# Patient Record
Sex: Female | Born: 1960 | Race: White | Hispanic: No | Marital: Married | Smoking: Current every day smoker
Health system: Southern US, Community
[De-identification: ages and names within clinical notes are randomized; demographics above are authoritative.]

## PROBLEM LIST (undated history)

## (undated) DIAGNOSIS — I341 Nonrheumatic mitral (valve) prolapse: Secondary | ICD-10-CM

## (undated) DIAGNOSIS — E7800 Pure hypercholesterolemia, unspecified: Secondary | ICD-10-CM

## (undated) DIAGNOSIS — I10 Essential (primary) hypertension: Secondary | ICD-10-CM

## (undated) DIAGNOSIS — N159 Renal tubulo-interstitial disease, unspecified: Secondary | ICD-10-CM

## (undated) HISTORY — PX: TONSILLECTOMY: SUR1361

---

## 2008-01-21 ENCOUNTER — Encounter (HOSPITAL_COMMUNITY): Admission: RE | Admit: 2008-01-21 | Discharge: 2008-02-20 | Payer: Self-pay | Admitting: Sports Medicine

## 2008-03-21 ENCOUNTER — Encounter (HOSPITAL_COMMUNITY): Admission: RE | Admit: 2008-03-21 | Discharge: 2008-04-13 | Payer: Self-pay | Admitting: Orthopedic Surgery

## 2008-04-18 ENCOUNTER — Encounter (HOSPITAL_COMMUNITY): Admission: RE | Admit: 2008-04-18 | Discharge: 2008-05-18 | Payer: Self-pay | Admitting: Orthopedic Surgery

## 2008-05-19 ENCOUNTER — Encounter (HOSPITAL_COMMUNITY): Admission: RE | Admit: 2008-05-19 | Discharge: 2008-06-18 | Payer: Self-pay | Admitting: Orthopedic Surgery

## 2008-06-20 ENCOUNTER — Encounter (HOSPITAL_COMMUNITY): Admission: RE | Admit: 2008-06-20 | Discharge: 2008-07-20 | Payer: Self-pay | Admitting: Orthopedic Surgery

## 2019-04-19 ENCOUNTER — Emergency Department (HOSPITAL_COMMUNITY): Payer: BC Managed Care – PPO | Admitting: Anesthesiology

## 2019-04-19 ENCOUNTER — Encounter (HOSPITAL_COMMUNITY): Payer: Self-pay | Admitting: Emergency Medicine

## 2019-04-19 ENCOUNTER — Observation Stay (HOSPITAL_COMMUNITY)
Admission: EM | Admit: 2019-04-19 | Discharge: 2019-04-20 | Disposition: A | Discharge status: Home / Self Care | Payer: BC Managed Care – PPO | Attending: General Surgery | Admitting: General Surgery

## 2019-04-19 ENCOUNTER — Encounter (HOSPITAL_COMMUNITY)
Admission: EM | Disposition: A | Discharge status: Home / Self Care | Payer: Self-pay | Source: Home / Self Care | Attending: Emergency Medicine

## 2019-04-19 ENCOUNTER — Emergency Department (HOSPITAL_COMMUNITY): Payer: BC Managed Care – PPO

## 2019-04-19 ENCOUNTER — Other Ambulatory Visit: Payer: Self-pay

## 2019-04-19 DIAGNOSIS — K353 Acute appendicitis with localized peritonitis, without perforation or gangrene: Principal | ICD-10-CM

## 2019-04-19 DIAGNOSIS — Z880 Allergy status to penicillin: Secondary | ICD-10-CM | POA: Insufficient documentation

## 2019-04-19 DIAGNOSIS — K3580 Unspecified acute appendicitis: Secondary | ICD-10-CM | POA: Diagnosis present

## 2019-04-19 DIAGNOSIS — Z20822 Contact with and (suspected) exposure to covid-19: Secondary | ICD-10-CM | POA: Diagnosis not present

## 2019-04-19 DIAGNOSIS — E7800 Pure hypercholesterolemia, unspecified: Secondary | ICD-10-CM | POA: Diagnosis not present

## 2019-04-19 DIAGNOSIS — I341 Nonrheumatic mitral (valve) prolapse: Secondary | ICD-10-CM | POA: Insufficient documentation

## 2019-04-19 DIAGNOSIS — N390 Urinary tract infection, site not specified: Secondary | ICD-10-CM | POA: Insufficient documentation

## 2019-04-19 DIAGNOSIS — I10 Essential (primary) hypertension: Secondary | ICD-10-CM | POA: Insufficient documentation

## 2019-04-19 DIAGNOSIS — N12 Tubulo-interstitial nephritis, not specified as acute or chronic: Secondary | ICD-10-CM

## 2019-04-19 DIAGNOSIS — Z885 Allergy status to narcotic agent status: Secondary | ICD-10-CM | POA: Diagnosis not present

## 2019-04-19 DIAGNOSIS — Z882 Allergy status to sulfonamides status: Secondary | ICD-10-CM | POA: Diagnosis not present

## 2019-04-19 DIAGNOSIS — F17210 Nicotine dependence, cigarettes, uncomplicated: Secondary | ICD-10-CM | POA: Insufficient documentation

## 2019-04-19 HISTORY — DX: Renal tubulo-interstitial disease, unspecified: N15.9

## 2019-04-19 HISTORY — PX: LAPAROSCOPIC APPENDECTOMY: SHX408

## 2019-04-19 HISTORY — DX: Pure hypercholesterolemia, unspecified: E78.00

## 2019-04-19 HISTORY — DX: Essential (primary) hypertension: I10

## 2019-04-19 HISTORY — DX: Nonrheumatic mitral (valve) prolapse: I34.1

## 2019-04-19 LAB — CBC WITH DIFFERENTIAL/PLATELET
Abs Immature Granulocytes: 0.06 10*3/uL (ref 0.00–0.07)
Basophils Absolute: 0 10*3/uL (ref 0.0–0.1)
Basophils Relative: 0 %
Eosinophils Absolute: 0 10*3/uL (ref 0.0–0.5)
Eosinophils Relative: 0 %
HCT: 47.8 % — ABNORMAL HIGH (ref 36.0–46.0)
Hemoglobin: 15.5 g/dL — ABNORMAL HIGH (ref 12.0–15.0)
Immature Granulocytes: 0 %
Lymphocytes Relative: 6 %
Lymphs Abs: 0.9 10*3/uL (ref 0.7–4.0)
MCH: 31.6 pg (ref 26.0–34.0)
MCHC: 32.4 g/dL (ref 30.0–36.0)
MCV: 97.4 fL (ref 80.0–100.0)
Monocytes Absolute: 0.6 10*3/uL (ref 0.1–1.0)
Monocytes Relative: 4 %
Neutro Abs: 13.4 10*3/uL — ABNORMAL HIGH (ref 1.7–7.7)
Neutrophils Relative %: 90 %
Platelets: 175 10*3/uL (ref 150–400)
RBC: 4.91 MIL/uL (ref 3.87–5.11)
RDW: 13 % (ref 11.5–15.5)
WBC: 15 10*3/uL — ABNORMAL HIGH (ref 4.0–10.5)
nRBC: 0 % (ref 0.0–0.2)

## 2019-04-19 LAB — LIPASE, BLOOD: Lipase: 19 U/L (ref 11–51)

## 2019-04-19 LAB — TROPONIN I (HIGH SENSITIVITY)
Troponin I (High Sensitivity): 14 ng/L (ref <18)
Troponin I (High Sensitivity): 26 ng/L — ABNORMAL HIGH (ref <18)

## 2019-04-19 LAB — URINALYSIS, ROUTINE W REFLEX MICROSCOPIC
Bilirubin Urine: NEGATIVE
Glucose, UA: NEGATIVE mg/dL
Ketones, ur: 20 mg/dL — AB
Leukocytes,Ua: NEGATIVE
Nitrite: NEGATIVE
Protein, ur: 100 mg/dL — AB
Specific Gravity, Urine: 1.026 (ref 1.005–1.030)
pH: 6 (ref 5.0–8.0)

## 2019-04-19 LAB — POC SARS CORONAVIRUS 2 AG -  ED: SARS Coronavirus 2 Ag: NEGATIVE

## 2019-04-19 LAB — COMPREHENSIVE METABOLIC PANEL WITH GFR
ALT: 28 U/L (ref 0–44)
AST: 23 U/L (ref 15–41)
Albumin: 4.7 g/dL (ref 3.5–5.0)
Alkaline Phosphatase: 107 U/L (ref 38–126)
Anion gap: 13 (ref 5–15)
BUN: 19 mg/dL (ref 6–20)
CO2: 23 mmol/L (ref 22–32)
Calcium: 10.3 mg/dL (ref 8.9–10.3)
Chloride: 100 mmol/L (ref 98–111)
Creatinine, Ser: 0.96 mg/dL (ref 0.44–1.00)
GFR calc Af Amer: >60 mL/min
GFR calc non Af Amer: >60 mL/min
Glucose, Bld: 138 mg/dL — ABNORMAL HIGH (ref 70–99)
Potassium: 3.9 mmol/L (ref 3.5–5.1)
Sodium: 136 mmol/L (ref 135–145)
Total Bilirubin: 0.6 mg/dL (ref 0.3–1.2)
Total Protein: 8.3 g/dL — ABNORMAL HIGH (ref 6.5–8.1)

## 2019-04-19 LAB — PROTIME-INR
INR: 1 (ref 0.8–1.2)
Prothrombin Time: 12.8 seconds (ref 11.4–15.2)

## 2019-04-19 LAB — RESPIRATORY PANEL BY RT PCR (FLU A&B, COVID)
Influenza A by PCR: NEGATIVE
Influenza B by PCR: NEGATIVE
SARS Coronavirus 2 by RT PCR: NEGATIVE

## 2019-04-19 LAB — LACTIC ACID, PLASMA
Lactic Acid, Venous: 2.4 mmol/L (ref 0.5–1.9)
Lactic Acid, Venous: 1.7 mmol/L (ref 0.5–1.9)

## 2019-04-19 LAB — APTT: aPTT: 27 seconds (ref 24–36)

## 2019-04-19 SURGERY — APPENDECTOMY, LAPAROSCOPIC
Anesthesia: General

## 2019-04-19 MED ORDER — CHLORHEXIDINE GLUCONATE CLOTH 2 % EX PADS: 6.0000 | MEDICATED_PAD | Freq: Once | CUTANEOUS | Status: DC

## 2019-04-19 MED ORDER — BUPIVACAINE LIPOSOME 1.3 % IJ SUSP
INTRAMUSCULAR | Status: DC | PRN
  Administered 2019-04-19: 20 mL

## 2019-04-19 MED ORDER — HEMOSTATIC AGENTS (NO CHARGE) OPTIME
TOPICAL | Status: DC | PRN
  Administered 2019-04-19: 1 via TOPICAL

## 2019-04-19 MED ORDER — SUCCINYLCHOLINE CHLORIDE 20 MG/ML IJ SOLN
INTRAMUSCULAR | Status: DC | PRN
  Administered 2019-04-19: 100 mg via INTRAVENOUS

## 2019-04-19 MED ORDER — LEVOFLOXACIN IN D5W 750 MG/150ML IV SOLN
750.0000 mg | Freq: Once | INTRAVENOUS | Status: AC
  Administered 2019-04-19: 15:00:00 750 mg via INTRAVENOUS
  Filled 2019-04-19: qty 150

## 2019-04-19 MED ORDER — METOPROLOL SUCCINATE ER 25 MG PO TB24
25.0000 mg | ORAL_TABLET | Freq: Every day | ORAL | Status: DC
  Administered 2019-04-20: 25 mg via ORAL
  Filled 2019-04-19: qty 1

## 2019-04-19 MED ORDER — METRONIDAZOLE IN NACL 5-0.79 MG/ML-% IV SOLN
500.0000 mg | Freq: Three times a day (TID) | INTRAVENOUS | Status: DC
  Administered 2019-04-19 – 2019-04-20 (×2): 500 mg via INTRAVENOUS
  Filled 2019-04-19 (×2): qty 100

## 2019-04-19 MED ORDER — DIPHENHYDRAMINE HCL 50 MG/ML IJ SOLN: 25.0000 mg | Freq: Four times a day (QID) | INTRAMUSCULAR | Status: DC | PRN

## 2019-04-19 MED ORDER — SODIUM CHLORIDE 0.9 % IV SOLN
1.0000 g | Freq: Once | INTRAVENOUS | Status: DC
  Filled 2019-04-19: qty 1

## 2019-04-19 MED ORDER — LEVOFLOXACIN IN D5W 750 MG/150ML IV SOLN: 750.0000 mg | INTRAVENOUS | Status: DC

## 2019-04-19 MED ORDER — HYDROMORPHONE HCL 1 MG/ML IJ SOLN
0.2500 mg | INTRAMUSCULAR | Status: DC | PRN
  Administered 2019-04-19 (×3): 0.5 mg via INTRAVENOUS
  Filled 2019-04-19 (×3): qty 0.5

## 2019-04-19 MED ORDER — BUPIVACAINE LIPOSOME 1.3 % IJ SUSP
INTRAMUSCULAR | Status: AC
  Filled 2019-04-19: qty 20

## 2019-04-19 MED ORDER — EPHEDRINE 5 MG/ML INJ
INTRAVENOUS | Status: AC
  Filled 2019-04-19: qty 10

## 2019-04-19 MED ORDER — SODIUM CHLORIDE 0.9 % IV SOLN: INTRAVENOUS | Status: DC

## 2019-04-19 MED ORDER — ONDANSETRON HCL 4 MG/2ML IJ SOLN
4.0000 mg | Freq: Once | INTRAMUSCULAR | Status: AC
  Administered 2019-04-19: 19:00:00 4 mg via INTRAVENOUS
  Filled 2019-04-19: qty 2

## 2019-04-19 MED ORDER — MORPHINE SULFATE (PF) 4 MG/ML IV SOLN
4.0000 mg | Freq: Once | INTRAVENOUS | Status: AC
  Administered 2019-04-19: 13:00:00 4 mg via INTRAVENOUS
  Filled 2019-04-19: qty 1

## 2019-04-19 MED ORDER — SPIRONOLACTONE 25 MG PO TABS
25.0000 mg | ORAL_TABLET | Freq: Every day | ORAL | Status: DC
  Administered 2019-04-20: 25 mg via ORAL
  Filled 2019-04-19: qty 1

## 2019-04-19 MED ORDER — ACETAMINOPHEN 325 MG PO TABS: 650.0000 mg | ORAL_TABLET | Freq: Four times a day (QID) | ORAL | Status: DC | PRN

## 2019-04-19 MED ORDER — PROMETHAZINE HCL 25 MG/ML IJ SOLN
12.5000 mg | Freq: Once | INTRAMUSCULAR | Status: AC
  Administered 2019-04-19: 13:00:00 12.5 mg via INTRAVENOUS
  Filled 2019-04-19: qty 1

## 2019-04-19 MED ORDER — ENOXAPARIN SODIUM 40 MG/0.4ML ~~LOC~~ SOLN
40.0000 mg | SUBCUTANEOUS | Status: DC
  Administered 2019-04-20: 09:00:00 40 mg via SUBCUTANEOUS
  Filled 2019-04-19: qty 0.4

## 2019-04-19 MED ORDER — AMLODIPINE BESYLATE 5 MG PO TABS
10.0000 mg | ORAL_TABLET | Freq: Every day | ORAL | Status: DC
  Administered 2019-04-20: 11:00:00 10 mg via ORAL
  Filled 2019-04-19: qty 2

## 2019-04-19 MED ORDER — KETOROLAC TROMETHAMINE 30 MG/ML IJ SOLN: 30.0000 mg | Freq: Four times a day (QID) | INTRAMUSCULAR | Status: DC | PRN

## 2019-04-19 MED ORDER — ONDANSETRON HCL 4 MG/2ML IJ SOLN
INTRAMUSCULAR | Status: AC
  Filled 2019-04-19: qty 2

## 2019-04-19 MED ORDER — DEXAMETHASONE SODIUM PHOSPHATE 10 MG/ML IJ SOLN
INTRAMUSCULAR | Status: AC
  Filled 2019-04-19: qty 1

## 2019-04-19 MED ORDER — LACTATED RINGERS IV SOLN: INTRAVENOUS | Status: DC | PRN

## 2019-04-19 MED ORDER — ONDANSETRON 4 MG PO TBDP: 4.0000 mg | ORAL_TABLET | Freq: Four times a day (QID) | ORAL | Status: DC | PRN

## 2019-04-19 MED ORDER — DIPHENHYDRAMINE HCL 25 MG PO CAPS: 25.0000 mg | ORAL_CAPSULE | Freq: Four times a day (QID) | ORAL | Status: DC | PRN

## 2019-04-19 MED ORDER — FENTANYL CITRATE (PF) 250 MCG/5ML IJ SOLN
INTRAMUSCULAR | Status: AC
  Filled 2019-04-19: qty 5

## 2019-04-19 MED ORDER — PROMETHAZINE HCL 25 MG/ML IJ SOLN
12.5000 mg | Freq: Once | INTRAMUSCULAR | Status: AC
  Administered 2019-04-19: 23:00:00 12.5 mg via INTRAVENOUS

## 2019-04-19 MED ORDER — KETOROLAC TROMETHAMINE 30 MG/ML IJ SOLN
30.0000 mg | Freq: Four times a day (QID) | INTRAMUSCULAR | Status: AC
  Administered 2019-04-19: 30 mg via INTRAVENOUS
  Filled 2019-04-19: qty 1

## 2019-04-19 MED ORDER — LORAZEPAM 2 MG/ML IJ SOLN: 1.0000 mg | INTRAMUSCULAR | Status: DC | PRN

## 2019-04-19 MED ORDER — ACETAMINOPHEN 650 MG RE SUPP: 650.0000 mg | Freq: Four times a day (QID) | RECTAL | Status: DC | PRN

## 2019-04-19 MED ORDER — PROPOFOL 10 MG/ML IV BOLUS
INTRAVENOUS | Status: DC | PRN
  Administered 2019-04-19: 160 mg via INTRAVENOUS

## 2019-04-19 MED ORDER — ROCURONIUM BROMIDE 100 MG/10ML IV SOLN
INTRAVENOUS | Status: DC | PRN
  Administered 2019-04-19: 40 mg via INTRAVENOUS

## 2019-04-19 MED ORDER — PROPOFOL 10 MG/ML IV BOLUS
INTRAVENOUS | Status: AC
  Filled 2019-04-19: qty 20

## 2019-04-19 MED ORDER — FENTANYL CITRATE (PF) 100 MCG/2ML IJ SOLN
INTRAMUSCULAR | Status: DC | PRN
  Administered 2019-04-19: 150 ug via INTRAVENOUS
  Administered 2019-04-19 (×2): 75 ug via INTRAVENOUS
  Administered 2019-04-19: 100 ug via INTRAVENOUS
  Administered 2019-04-19: 50 ug via INTRAVENOUS

## 2019-04-19 MED ORDER — HYDROCODONE-ACETAMINOPHEN 7.5-325 MG PO TABS: 1.0000 | ORAL_TABLET | Freq: Once | ORAL | Status: DC | PRN

## 2019-04-19 MED ORDER — FENTANYL CITRATE (PF) 100 MCG/2ML IJ SOLN: 50.0000 ug | INTRAMUSCULAR | Status: DC | PRN

## 2019-04-19 MED ORDER — DEXAMETHASONE SODIUM PHOSPHATE 10 MG/ML IJ SOLN
INTRAMUSCULAR | Status: DC | PRN
  Administered 2019-04-19: 5 mg via INTRAVENOUS

## 2019-04-19 MED ORDER — LIDOCAINE 2% (20 MG/ML) 5 ML SYRINGE
INTRAMUSCULAR | Status: AC
  Filled 2019-04-19: qty 5

## 2019-04-19 MED ORDER — LACTATED RINGERS IV SOLN: INTRAVENOUS | Status: DC

## 2019-04-19 MED ORDER — MIDAZOLAM HCL 2 MG/2ML IJ SOLN: 0.5000 mg | Freq: Once | INTRAMUSCULAR | Status: DC | PRN

## 2019-04-19 MED ORDER — SODIUM CHLORIDE 0.9 % IR SOLN
Status: DC | PRN
  Administered 2019-04-19: 1000 mL

## 2019-04-19 MED ORDER — KETOROLAC TROMETHAMINE 30 MG/ML IJ SOLN
INTRAMUSCULAR | Status: AC
  Filled 2019-04-19: qty 1

## 2019-04-19 MED ORDER — PROMETHAZINE HCL 25 MG/ML IJ SOLN
6.2500 mg | INTRAMUSCULAR | Status: AC | PRN
  Administered 2019-04-19 (×2): 6.25 mg via INTRAVENOUS
  Filled 2019-04-19: qty 1

## 2019-04-19 MED ORDER — ONDANSETRON HCL 4 MG/2ML IJ SOLN
INTRAMUSCULAR | Status: DC | PRN
  Administered 2019-04-19: 4 mg via INTRAVENOUS

## 2019-04-19 MED ORDER — ONDANSETRON HCL 4 MG/2ML IJ SOLN
4.0000 mg | Freq: Four times a day (QID) | INTRAMUSCULAR | Status: DC | PRN
  Administered 2019-04-19 – 2019-04-20 (×2): 4 mg via INTRAVENOUS
  Filled 2019-04-19: qty 2

## 2019-04-19 MED ORDER — METRONIDAZOLE IN NACL 5-0.79 MG/ML-% IV SOLN
500.0000 mg | Freq: Once | INTRAVENOUS | Status: AC
  Administered 2019-04-19: 15:00:00 500 mg via INTRAVENOUS
  Filled 2019-04-19: qty 100

## 2019-04-19 MED ORDER — SODIUM CHLORIDE 0.9 % IV BOLUS
1000.0000 mL | Freq: Once | INTRAVENOUS | Status: AC
  Administered 2019-04-19: 13:00:00 1000 mL via INTRAVENOUS

## 2019-04-19 MED ORDER — TRAMADOL HCL 50 MG PO TABS
50.0000 mg | ORAL_TABLET | Freq: Four times a day (QID) | ORAL | Status: DC | PRN
  Administered 2019-04-20: 09:00:00 50 mg via ORAL
  Filled 2019-04-19: qty 1

## 2019-04-19 MED ORDER — ROSUVASTATIN CALCIUM 10 MG PO TABS
10.0000 mg | ORAL_TABLET | Freq: Every day | ORAL | Status: DC
  Administered 2019-04-20: 10 mg via ORAL
  Filled 2019-04-19: qty 1

## 2019-04-19 MED ORDER — LEVOFLOXACIN IN D5W 500 MG/100ML IV SOLN: 500.0000 mg | INTRAVENOUS | Status: DC

## 2019-04-19 MED ORDER — EPHEDRINE SULFATE 50 MG/ML IJ SOLN
INTRAMUSCULAR | Status: DC | PRN
  Administered 2019-04-19 (×2): 10 mg via INTRAVENOUS

## 2019-04-19 MED ORDER — SCOPOLAMINE 1 MG/3DAYS TD PT72
MEDICATED_PATCH | TRANSDERMAL | Status: AC
  Filled 2019-04-19: qty 1

## 2019-04-19 MED ORDER — SCOPOLAMINE 1 MG/3DAYS TD PT72
1.0000 | MEDICATED_PATCH | TRANSDERMAL | Status: DC
  Administered 2019-04-19: 22:00:00 1.5 mg via TRANSDERMAL

## 2019-04-19 MED ORDER — SODIUM CHLORIDE 0.9 % IV BOLUS (SEPSIS)
1500.0000 mL | Freq: Once | INTRAVENOUS | Status: AC
  Administered 2019-04-19: 1500 mL via INTRAVENOUS

## 2019-04-19 MED ORDER — SODIUM CHLORIDE 0.9 % IR SOLN
Status: DC | PRN
  Administered 2019-04-19: 3000 mL

## 2019-04-19 MED ORDER — IOHEXOL 300 MG/ML  SOLN
100.0000 mL | Freq: Once | INTRAMUSCULAR | Status: AC | PRN
  Administered 2019-04-19: 16:00:00 100 mL via INTRAVENOUS

## 2019-04-19 MED ORDER — SIMETHICONE 80 MG PO CHEW: 40.0000 mg | CHEWABLE_TABLET | Freq: Four times a day (QID) | ORAL | Status: DC | PRN

## 2019-04-19 MED ORDER — SUGAMMADEX SODIUM 500 MG/5ML IV SOLN
INTRAVENOUS | Status: DC | PRN
  Administered 2019-04-19: 133.4 mg via INTRAVENOUS

## 2019-04-19 SURGICAL SUPPLY — 51 items
BAG RETRIEVAL 10 (BASKET) ×1
BAG RETRIEVAL 10MM (BASKET) ×1
CHLORAPREP W/TINT 26 (MISCELLANEOUS) ×3 IMPLANT
CLOTH BEACON ORANGE TIMEOUT ST (SAFETY) ×3 IMPLANT
COVER LIGHT HANDLE STERIS (MISCELLANEOUS) ×6 IMPLANT
COVER WAND RF STERILE (DRAPES) ×3 IMPLANT
CUTTER FLEX LINEAR 45M (STAPLE) ×3 IMPLANT
DECANTER SPIKE VIAL GLASS SM (MISCELLANEOUS) ×3 IMPLANT
DERMABOND ADVANCED (GAUZE/BANDAGES/DRESSINGS) ×2
DERMABOND ADVANCED .7 DNX12 (GAUZE/BANDAGES/DRESSINGS) ×1 IMPLANT
ELECT REM PT RETURN 9FT ADLT (ELECTROSURGICAL) ×3
ELECTRODE REM PT RTRN 9FT ADLT (ELECTROSURGICAL) ×1 IMPLANT
GLOVE BIO SURGEON STRL SZ7 (GLOVE) ×3 IMPLANT
GLOVE BIOGEL PI IND STRL 7.0 (GLOVE) ×4 IMPLANT
GLOVE BIOGEL PI IND STRL 7.5 (GLOVE) ×1 IMPLANT
GLOVE BIOGEL PI INDICATOR 7.0 (GLOVE) ×8
GLOVE BIOGEL PI INDICATOR 7.5 (GLOVE) ×2
GLOVE SURG SS PI 7.5 STRL IVOR (GLOVE) ×3 IMPLANT
GOWN STRL REUS W/TWL LRG LVL3 (GOWN DISPOSABLE) ×6 IMPLANT
HEMOSTAT SURGICEL 4X8 (HEMOSTASIS) ×3 IMPLANT
INST SET LAPROSCOPIC AP (KITS) ×3 IMPLANT
IV NS IRRIG 3000ML ARTHROMATIC (IV SOLUTION) ×3 IMPLANT
KIT TURNOVER KIT A (KITS) ×3 IMPLANT
MANIFOLD NEPTUNE II (INSTRUMENTS) ×3 IMPLANT
NEEDLE HYPO 18GX1.5 BLUNT FILL (NEEDLE) ×3 IMPLANT
NEEDLE HYPO 22GX1.5 SAFETY (NEEDLE) ×3 IMPLANT
NEEDLE INSUFFLATION 14GA 120MM (NEEDLE) ×3 IMPLANT
NS IRRIG 1000ML POUR BTL (IV SOLUTION) ×3 IMPLANT
PACK LAP CHOLE LZT030E (CUSTOM PROCEDURE TRAY) ×3 IMPLANT
PAD ARMBOARD 7.5X6 YLW CONV (MISCELLANEOUS) ×3 IMPLANT
PENCIL HANDSWITCHING (ELECTRODE) ×3 IMPLANT
RELOAD 45 VASCULAR/THIN (ENDOMECHANICALS) IMPLANT
RELOAD STAPLE TA45 3.5 REG BLU (ENDOMECHANICALS) ×3 IMPLANT
SET BASIN LINEN APH (SET/KITS/TRAYS/PACK) ×3 IMPLANT
SET TUBE IRRIG SUCTION NO TIP (IRRIGATION / IRRIGATOR) ×3 IMPLANT
SET TUBE SMOKE EVAC HIGH FLOW (TUBING) ×3 IMPLANT
SHEARS HARMONIC ACE PLUS 36CM (ENDOMECHANICALS) ×3 IMPLANT
SUT MNCRL AB 4-0 PS2 18 (SUTURE) ×9 IMPLANT
SUT VICRYL 0 UR6 27IN ABS (SUTURE) ×3 IMPLANT
SYR 20ML LL LF (SYRINGE) ×6 IMPLANT
SYS BAG RETRIEVAL 10MM (BASKET) ×1
SYSTEM BAG RETRIEVAL 10MM (BASKET) ×1 IMPLANT
TRAY FOLEY W/BAG SLVR 16FR (SET/KITS/TRAYS/PACK) ×2
TRAY FOLEY W/BAG SLVR 16FR ST (SET/KITS/TRAYS/PACK) ×1 IMPLANT
TROCAR ENDO BLADELESS 11MM (ENDOMECHANICALS) ×3 IMPLANT
TROCAR ENDO BLADELESS 12MM (ENDOMECHANICALS) ×3 IMPLANT
TROCAR XCEL NON-BLD 5MMX100MML (ENDOMECHANICALS) ×3 IMPLANT
TUBE CONNECTING 12'X1/4 (SUCTIONS) ×1
TUBE CONNECTING 12X1/4 (SUCTIONS) ×2 IMPLANT
WARMER LAPAROSCOPE (MISCELLANEOUS) ×3 IMPLANT
YANKAUER SUCT 12FT TUBE ARGYLE (SUCTIONS) ×3 IMPLANT

## 2019-04-19 NOTE — ED Notes (Signed)
Not able to obtain 2nd IV, pt very difficult stick.

## 2019-04-19 NOTE — H&P (Signed)
Chloe Wall is an 59 y.o. female.   Chief Complaint: Right lower quadrant abdominal pain HPI: Patient is a 59 year old white female who presented emergency room with a 24-hour history of worsening right lower quadrant abdominal pain, nausea, and vomiting.  CT scan of the abdomen reveals acute appendicitis with appendicolith present.  She states her pain started on the left side of her abdomen yesterday morning.  Throughout the day, she continued to get worse and started developing nausea and vomiting.  She thought initially was a urinary tract infection, but a CT scan of the abdomen was performed.  She states her pain is 8 out of 10.  Past Medical History:  Diagnosis Date  . High cholesterol   . Hypertension   . Kidney infection   . Mitral valve prolapse     Past Surgical History:  Procedure Laterality Date  . TONSILLECTOMY      History reviewed. No pertinent family history. Social History:  reports that she has been smoking cigarettes. She has never used smokeless tobacco. She reports that she does not drink alcohol or use drugs.  Allergies:  Allergies  Allergen Reactions  . Penicillins Anaphylaxis    Did it involve swelling of the face/tongue/throat, SOB, or low BP? Yes Did it involve sudden or severe rash/hives, skin peeling, or any reaction on the inside of your mouth or nose? Yes Did you need to seek medical attention at a hospital or doctor's office? Yes When did it last happen? If all above answers are "NO", may proceed with cephalosporin use.      . Codeine Other (See Comments)    Abdominal pain, vomiting   . Sulfa Antibiotics Nausea And Vomiting    (Not in a hospital admission)   Results for orders placed or performed during the hospital encounter of 04/19/19 (from the past 48 hour(s))  Urinalysis, Routine w reflex microscopic     Status: Abnormal   Collection Time: 04/19/19 12:14 PM  Result Value Ref Range   Color, Urine YELLOW YELLOW   APPearance  HAZY (A) CLEAR   Specific Gravity, Urine 1.026 1.005 - 1.030   pH 6.0 5.0 - 8.0   Glucose, UA NEGATIVE NEGATIVE mg/dL   Hgb urine dipstick LARGE (A) NEGATIVE   Bilirubin Urine NEGATIVE NEGATIVE   Ketones, ur 20 (A) NEGATIVE mg/dL   Protein, ur 100 (A) NEGATIVE mg/dL   Nitrite NEGATIVE NEGATIVE   Leukocytes,Ua NEGATIVE NEGATIVE   RBC / HPF 11-20 0 - 5 RBC/hpf   WBC, UA 6-10 0 - 5 WBC/hpf   Bacteria, UA MANY (A) NONE SEEN   Squamous Epithelial / LPF 6-10 0 - 5   Mucus PRESENT     Comment: Performed at [facility], [address], [place], [place] [address]  Comprehensive metabolic panel     Status: Abnormal   Collection Time: 04/19/19 12:56 PM  Result Value Ref Range   Sodium 136 135 - 145 mmol/L   Potassium 3.9 3.5 - 5.1 mmol/L   Chloride 100 98 - 111 mmol/L   CO2 23 22 - 32 mmol/L   Glucose, Bld 138 (H) 70 - 99 mg/dL   BUN 19 6 - 20 mg/dL   Creatinine, Ser 0.96 0.44 - 1.00 mg/dL   Calcium 10.3 8.9 - 10.3 mg/dL   Total Protein 8.3 (H) 6.5 - 8.1 g/dL   Albumin 4.7 3.5 - 5.0 g/dL   AST 23 15 - 41 U/L   ALT 28 0 - 44 U/L  Alkaline Phosphatase 107 38 - 126 U/L   Total Bilirubin 0.6 0.3 - 1.2 mg/dL   GFR calc non Af Amer >60 >60 mL/min   GFR calc Af Amer >60 >60 mL/min   Anion gap 13 5 - 15    Comment: Performed at [facility], [address], [place], [place] [address]  Lipase, blood     Status: None   Collection Time: 04/19/19 12:56 PM  Result Value Ref Range   Lipase 19 11 - 51 U/L    Comment: Performed at [facility], [address], [place], [place] [address]  CBC with Differential     Status: Abnormal   Collection Time: 04/19/19 12:56 PM  Result Value Ref Range   WBC 15.0 (H) 4.0 - 10.5 K/uL   RBC 4.91 3.87 - 5.11 MIL/uL   Hemoglobin 15.5 (H) 12.0 - 15.0 g/dL   HCT 47.8 (H) 36.0 - 46.0 %   MCV 97.4 80.0 - 100.0 fL   MCH 31.6 26.0 - 34.0 pg   MCHC 32.4 30.0 - 36.0 g/dL   RDW 13.0 11.5 - 15.5 %   Platelets 175 150 - 400 K/uL   nRBC 0.0 0.0 - 0.2 %   Neutrophils  Relative % 90 %   Neutro Abs 13.4 (H) 1.7 - 7.7 K/uL   Lymphocytes Relative 6 %   Lymphs Abs 0.9 0.7 - 4.0 K/uL   Monocytes Relative 4 %   Monocytes Absolute 0.6 0.1 - 1.0 K/uL   Eosinophils Relative 0 %   Eosinophils Absolute 0.0 0.0 - 0.5 K/uL   Basophils Relative 0 %   Basophils Absolute 0.0 0.0 - 0.1 K/uL   Immature Granulocytes 0 %   Abs Immature Granulocytes 0.06 0.00 - 0.07 K/uL    Comment: Performed at [facility], [address], [place], [place] [address]  Lactic acid, plasma     Status: Abnormal   Collection Time: 04/19/19 12:56 PM  Result Value Ref Range   Lactic Acid, Venous 2.4 (HH) 0.5 - 1.9 mmol/L    Comment: CRITICAL RESULT CALLED TO, READ BACK BY AND VERIFIED WITH: MARTIN,D AT 1340 ON 1.5.21 BY ISLEY,B Performed at [facility], [address], [place], [place] [address]   POC SARS Coronavirus 2 Ag-ED - Nasal Swab (BD Veritor Kit)     Status: None   Collection Time: 04/19/19  2:14 PM  Result Value Ref Range   SARS Coronavirus 2 Ag NEGATIVE NEGATIVE    Comment: (NOTE) SARS-CoV-2 antigen NOT DETECTED.  Negative results are presumptive.  Negative results do not preclude SARS-CoV-2 infection and should not be used as the sole basis for treatment or other patient management decisions, including infection  control decisions, particularly in the presence of clinical signs and  symptoms consistent with COVID-19, or in those who have been in contact with the virus.  Negative results must be combined with clinical observations, patient history, and epidemiological information. The expected result is Negative. Fact Sheet for Patients: PodPark.tn Fact Sheet for Healthcare Providers: GiftContent.is This test is not yet approved or cleared by the [place] FDA and  has been authorized for detection and/or diagnosis of SARS-CoV-2 by FDA under an Emergency Use Authorization (EUA).  This EUA will remain in effect  (meaning this test can be used) for the duration of  the COVID-19 de claration under Section 564(b)(1) of the Act, 21 U.S.C. section 360bbb-3(b)(1), unless the authorization is terminated or revoked sooner.   APTT     Status: None  Collection Time: 04/19/19  3:15 PM  Result Value Ref Range   aPTT 27 24 - 36 seconds    Comment: Performed at [facility], [address], [place], [place] [address]  Protime-INR     Status: None   Collection Time: 04/19/19  3:15 PM  Result Value Ref Range   Prothrombin Time 12.8 11.4 - 15.2 seconds   INR 1.0 0.8 - 1.2    Comment: (NOTE) INR goal varies based on device and disease states. Performed at [facility], [address] [place], [place] [address]   Lactic acid, plasma     Status: None   Collection Time: 04/19/19  3:16 PM  Result Value Ref Range   Lactic Acid, Venous 1.7 0.5 - 1.9 mmol/L    Comment: Performed at [facility], [address] [place], [place] [address]  Blood Culture (routine x 2)     Status: None (Preliminary result)   Collection Time: 04/19/19  3:16 PM   Specimen: BLOOD RIGHT HAND  Result Value Ref Range   Specimen Description BLOOD RIGHT HAND    Special Requests      BOTTLES DRAWN AEROBIC ONLY Blood Culture adequate volume Performed at [facility], [address] [place], [place] [address]    Culture PENDING    Report Status PENDING   Troponin I (High Sensitivity)     Status: None   Collection Time: 04/19/19  3:16 PM  Result Value Ref Range   Troponin I (High Sensitivity) 14 <18 ng/L    Comment: (NOTE) Elevated high sensitivity troponin I (hsTnI) values and significant  changes across serial measurements may suggest ACS but many other  chronic and acute conditions are known to elevate hsTnI results.  Refer to the "Links" section for chest pain algorithms and additional  guidance. Performed at [facility], [address] [place], [place] [address]   Troponin I (High Sensitivity)     Status: Abnormal   Collection  Time: 04/19/19  5:03 PM  Result Value Ref Range   Troponin I (High Sensitivity) 26 (H) <18 ng/L    Comment: (NOTE) Elevated high sensitivity troponin I (hsTnI) values and significant  changes across serial measurements may suggest ACS but many other  chronic and acute conditions are known to elevate hsTnI results.  Refer to the "Links" section for chest pain algorithms and additional  guidance. Performed at [facility], [address], [place], [place] [address]   Respiratory Panel by RT PCR (Flu A&B, Covid) - Nasopharyngeal Swab     Status: None   Collection Time: 04/19/19  5:12 PM   Specimen: Nasopharyngeal Swab  Result Value Ref Range   SARS Coronavirus 2 by RT PCR NEGATIVE NEGATIVE    Comment: (NOTE) SARS-CoV-2 target nucleic acids are NOT DETECTED. The SARS-CoV-2 RNA is generally detectable in upper respiratoy specimens during the acute phase of infection. The lowest concentration of SARS-CoV-2 viral copies this assay can detect is 131 copies/mL. A negative result does not preclude SARS-Cov-2 infection and should not be used as the sole basis for treatment or other patient management decisions. A negative result may occur with  improper specimen collection/handling, submission of specimen other than nasopharyngeal swab, presence of viral mutation(s) within the areas targeted by this assay, and inadequate number of viral copies (<131 copies/mL). A negative result must be combined with clinical observations, patient history, and epidemiological information. The expected result is Negative. Fact Sheet for Patients:  PinkCheek.be Fact Sheet for Healthcare Providers:  GravelBags.it This test  is not yet ap proved or cleared by the [place] and  has been authorized for detection and/or diagnosis of SARS-CoV-2 by FDA under an Emergency Use Authorization (EUA). This EUA will remain  in effect (meaning this test can be  used) for the duration of the COVID-19 declaration under Section 564(b)(1) of the Act, 21 U.S.C. section 360bbb-3(b)(1), unless the authorization is terminated or revoked sooner.    Influenza A by PCR NEGATIVE NEGATIVE   Influenza B by PCR NEGATIVE NEGATIVE    Comment: (NOTE) The Xpert Xpress SARS-CoV-2/FLU/RSV assay is intended as an aid in  the diagnosis of influenza from Nasopharyngeal swab specimens and  should not be used as a sole basis for treatment. Nasal washings and  aspirates are unacceptable for Xpert Xpress SARS-CoV-2/FLU/RSV  testing. Fact Sheet for Patients: PinkCheek.be Fact Sheet for Healthcare Providers: GravelBags.it This test is not yet approved or cleared by the [place] FDA and  has been authorized for detection and/or diagnosis of SARS-CoV-2 by  FDA under an Emergency Use Authorization (EUA). This EUA will remain  in effect (meaning this test can be used) for the duration of the  Covid-19 declaration under Section 564(b)(1) of the Act, 21  U.S.C. section 360bbb-3(b)(1), unless the authorization is  terminated or revoked. Performed at [facility], [address], [place], [place] [address]    [place]  Addendum Date: 04/19/2019   ADDENDUM REPORT: 04/19/2019 16:54 ADDENDUM: Results were discussed with Dr. Milton Ferguson at 4:53 p.m. [place] on April 19, 2019. Electronically Signed   By: Virgina Norfolk M.D.   On: 04/19/2019 16:54   Result Date: 04/19/2019 CLINICAL DATA:  Right lower quadrant pain. EXAM: CT ABDOMEN AND PELVIS WITH CONTRAST TECHNIQUE: Multidetector CT imaging of the abdomen and pelvis was performed using the standard protocol following bolus administration of intravenous contrast. CONTRAST:  128m OMNIPAQUE IOHEXOL 300 MG/ML  SOLN COMPARISON:  None. FINDINGS: Lower chest: No acute abnormality. Hepatobiliary: No focal liver abnormality is seen. No gallstones, gallbladder  wall thickening, or biliary dilatation. Pancreas: Unremarkable. No pancreatic ductal dilatation or surrounding inflammatory changes. Spleen: Normal in size without focal abnormality. Adrenals/Urinary Tract: Adrenal glands are unremarkable. Kidneys are normal, without renal calculi or hydronephrosis. A 9 mm simple cyst is seen within the posterolateral aspect of the mid right kidney. Bladder is unremarkable. Stomach/Bowel: Stomach is within normal limits. A dilated (1.3 cm) fluid-filled appendix is seen. There is a mild amount of periappendiceal inflammatory fat stranding. A 1.0 cm appendicolith is noted within the base of the appendix. Vascular/Lymphatic: There is moderate severity atherosclerosis. No enlarged abdominal or pelvic lymph nodes. Reproductive: Uterus and bilateral adnexa are unremarkable. Other: No abdominal wall hernia or abnormality. No abdominopelvic ascites. Musculoskeletal: No acute or significant osseous findings. IMPRESSION: 1. Appendicitis without evidence of associated perforation or abscess. Electronically Signed: By: TVirgina NorfolkM.D. On: 04/19/2019 16:48   DG Chest Port 1 View  Result Date: 04/19/2019 CLINICAL DATA:  Sepsis EXAM: PORTABLE CHEST 1 VIEW COMPARISON:  None. FINDINGS: The heart size and mediastinal contours are within normal limits. No focal airspace consolidation, pleural effusion, or pneumothorax. Widened appearance of the right acromioclavicular joint which may be postsurgical or posttraumatic. The visualized skeletal structures are otherwise unremarkable. IMPRESSION: No acute cardiopulmonary findings. Electronically Signed   By: NDavina PokeD.O.   On: 04/19/2019 14:43    Review of Systems  Constitutional: Positive for fatigue.  HENT: Negative.   Eyes: Negative.   Respiratory: Negative.  Cardiovascular: Negative.   Gastrointestinal: Positive for abdominal pain, nausea and vomiting.  Endocrine: Negative.   Genitourinary: Positive for dysuria.   Musculoskeletal: Negative.   Allergic/Immunologic: Negative.   Neurological: Negative.   Hematological: Negative.   Psychiatric/Behavioral: Negative.     Blood pressure (!) 148/53, pulse 73, temperature 98.5 F (36.9 C), temperature source Oral, resp. rate 17, height _0  (1.626 m), weight 66.7 kg, SpO2 98 %. Physical Exam  Vitals reviewed. Constitutional: She is oriented to person, place, and time. She appears well-developed and well-nourished. No distress.  HENT:  Head: Atraumatic.  Cardiovascular: Normal rate, regular rhythm and normal heart sounds. Exam reveals no gallop and no friction rub.  No murmur heard. Respiratory: Effort normal and breath sounds normal. No respiratory distress. She has no wheezes. She has no rales.  GI: Soft. Bowel sounds are normal. She exhibits no distension. There is abdominal tenderness. There is guarding. There is no rebound.  Tender in the right lower quadrant just inferior to  McBurney's point.  No rigidity is noted.  Neurological: She is alert and oriented to person, place, and time.  Skin: Skin is warm and dry.  CT scan reviewed  Assessment/Plan Impression: Acute appendicitis Plan: Patient be taken to the operating room for laparoscopic appendectomy.  The risks and benefits of the procedure including bleeding, infection, and the possibility of an open procedure were fully explained to the patient, who gave informed consent.  Aviva Signs, MD 04/19/2019, 6:28 PM

## 2019-04-19 NOTE — Anesthesia Postprocedure Evaluation (Signed)
Anesthesia Post Note  Patient: Chloe Wall  Procedure(s) Performed: APPENDECTOMY LAPAROSCOPIC (N/A )  Patient location during evaluation: PACU Anesthesia Type: General Level of consciousness: awake, awake and alert and oriented Pain management: pain level controlled Vital Signs Assessment: post-procedure vital signs reviewed and stable Respiratory status: spontaneous breathing, respiratory function stable and nonlabored ventilation Cardiovascular status: blood pressure returned to baseline and stable Postop Assessment: no headache Anesthetic complications: no     Last Vitals:  Vitals:   04/19/19 1930 04/19/19 2103  BP: (!) 140/43   Pulse:    Resp:  (P) 15  Temp:  (P) 36.7 C  SpO2:      Last Pain:  Vitals:   04/19/19 1845  TempSrc: Oral  PainSc:                  Dorena Cookey

## 2019-04-19 NOTE — Anesthesia Procedure Notes (Signed)
Procedure Name: Intubation Date/Time: 04/19/2019 7:58 PM Performed by: Lenice Llamas, MD Pre-anesthesia Checklist: Patient identified, Emergency Drugs available, Suction available, Patient being monitored and Timeout performed Patient Re-evaluated:Patient Re-evaluated prior to induction Oxygen Delivery Method: Circle system utilized Preoxygenation: Pre-oxygenation with 100% oxygen Induction Type: IV induction, Cricoid Pressure applied and Rapid sequence Laryngoscope Size: Mac and 4 Grade View: Grade II Tube type: Oral Tube size: 7.0 mm Number of attempts: 1 Airway Equipment and Method: Stylet and Bite block Placement Confirmation: ETT inserted through vocal cords under direct vision,  positive ETCO2 and breath sounds checked- equal and bilateral Secured at: 22 cm Tube secured with: Tape Dental Injury: Teeth and Oropharynx as per pre-operative assessment

## 2019-04-19 NOTE — Op Note (Signed)
Patient:  Chloe Wall  DOB:  May 30, 1960  MRN:  [id number]   Preop Diagnosis: Acute appendicitis  Postop Diagnosis: Same  Procedure: Laparoscopic appendectomy  Surgeon: Franky Macho, MD  Anes: General endotracheal  Indications: Patient is a 59 year old white female who presents with a 24-hour history of worsening right lower quadrant abdominal pain.  CT scan of the abdomen reveals acute appendicitis.  The risks and benefits of the procedure including bleeding, infection, and the possibility of an open procedure were fully explained to the patient, who gave informed consent.  Procedure note: The patient was placed in supine position.  After induction of general endotracheal anesthesia, the abdomen was prepped and draped using usual sterile technique with ChloraPrep.  Surgical site confirmation was performed.  A supraumbilical incision was made down to the fascia.  The Veress needle was introduced into the abdominal cavity and confirmation of placement was done using the saline drop test.  The abdomen was then insufflated to 15 mmHg pressure.  An 11 mm trocar was introduced into the abdominal cavity under direct visualization without difficulty.  The patient was placed in deeper Trendelenburg position and an additional 12 mm trocar was placed in the suprapubic region and a 5 mm trocar was placed in the left lower quadrant region.  The appendix was visualized and noted to be diffusely necrotic.  The mesoappendix was divided using the harmonic scalpel.  During the dissection, a small opening was noted just above the juncture of the appendix to the cecum.  Minimal spillage was found and this was aspirated.  The standard Endo GIA was placed across the base the appendix and fired.  The appendix was then removed using an Endo Catch bag without difficulty.  The right lower quadrant was copiously irrigated with normal saline.  The staple line was inspected and there was no evidence of disruption.   Surgicel was placed over the staple line.  All fluid and air were then evacuated from the abdominal cavity prior to removal of the trochars.  All wounds were irrigated with normal saline.  All wounds were injected with Exparel.  The supraumbilical fascia was reapproximated using an 0 Vicryl interrupted suture.  All skin incisions were closed using a 4-0 Monocryl subcuticular suture.  Dermabond was applied.  All tape needle counts were correct at the end of the procedure.  The patient was extubated in the operating room and transferred to PACU in stable condition.  Complications: None  EBL: Minimal  Specimen: Appendix

## 2019-04-19 NOTE — Anesthesia Preprocedure Evaluation (Addendum)
Anesthesia Evaluation  Patient identified by MRN, date of birth, ID band Patient awake    Reviewed: Allergy & Precautions, NPO status , Patient's Chart, lab work & pertinent test results  Airway Mallampati: II  TM Distance: >3 FB Neck ROM: Full    Dental no notable dental hx. (+) Teeth Intact   Pulmonary neg pulmonary ROS, Current SmokerPatient did not abstain from smoking.,    Pulmonary exam normal breath sounds clear to auscultation       Cardiovascular Exercise Tolerance: Good hypertension, Pt. on medications negative cardio ROS Normal cardiovascular examI Rhythm:Regular Rate:Normal  MVP -no limitation   Neuro/Psych negative neurological ROS  negative psych ROS   GI/Hepatic negative GI ROS, Neg liver ROS,   Endo/Other  negative endocrine ROS  Renal/GU Renal InsufficiencyRenal disease  Female GU complaint     Musculoskeletal negative musculoskeletal ROS (+)   Abdominal   Peds negative pediatric ROS (+)  Hematology negative hematology ROS (+)   Anesthesia Other Findings   Reproductive/Obstetrics negative OB ROS                            Anesthesia Physical Anesthesia Plan  ASA: II and emergent  Anesthesia Plan: General   Post-op Pain Management:    Induction: Intravenous  PONV Risk Score and Plan: 2 and Ondansetron, Dexamethasone and Treatment may vary due to age or medical condition  Airway Management Planned: Oral ETT  Additional Equipment:   Intra-op Plan:   Post-operative Plan: Extubation in OR  Informed Consent: I have reviewed the patients History and Physical, chart, labs and discussed the procedure including the risks, benefits and alternatives for the proposed anesthesia with the patient or authorized representative who has indicated his/her understanding and acceptance.     Dental advisory given  Plan Discussed with:   Anesthesia Plan Comments: (Plan  Full PPE use Plan GETA D/W PT -WTP with same after Q&A)        Anesthesia Quick Evaluation

## 2019-04-19 NOTE — Progress Notes (Signed)
Pharmacy Antibiotic Note  Chloe Wall is a 59 y.o. female admitted on 04/19/2019 with intra-abdominal infection.  Pharmacy has been consulted for levaquin dosing.  Plan: Levaquin 750mg  IV q24h Also on flagyl 500mg  IV q8h F/U on cxs and clinical progress Monitor V/S and labs  Height: 5\' 4"  (162.6 cm) Weight: 147 lb (66.7 kg) IBW/kg (Calculated) : 54.7  Temp (24hrs), Avg:98.5 F (36.9 C), Min:98.5 F (36.9 C), Max:98.5 F (36.9 C)  Recent Labs  Lab 04/19/19 1256  WBC 15.0*  CREATININE 0.96  LATICACIDVEN 2.4*    Estimated Creatinine Clearance: 60 mL/min (by C-G formula based on SCr of 0.96 mg/dL).    Allergies  Allergen Reactions  . Penicillins Anaphylaxis    Did it involve swelling of the face/tongue/throat, SOB, or low BP? Yes Did it involve sudden or severe rash/hives, skin peeling, or any reaction on the inside of your mouth or nose? Yes Did you need to seek medical attention at a hospital or doctor's office? Yes When did it last happen? If all above answers are "NO", may proceed with cephalosporin use.      . Codeine Other (See Comments)    Abdominal pain, vomiting   . Sulfa Antibiotics Nausea And Vomiting    Antimicrobials this admission: levaquin 1/5>>  Flagyl 1/5 >>   Microbiology results: 1/5 BCx: pending 1/5 UCx: pending  Thank you for allowing pharmacy to be a part of this patient's care.  , BS Pharm D, Clinical Pharmacist Pager [phone] 04/19/2019 2:14 PM

## 2019-04-19 NOTE — ED Notes (Signed)
CRITICAL VALUE ALERT  Critical Value:  Lactic Acid 2.4  Date & Time Notied:  04/19/2019 @ 1340  Provider Notified: Dr Juleen China   Orders Received/Actions taken: See new orders

## 2019-04-19 NOTE — ED Notes (Signed)
Blood cultures drawn x 2 by lab 

## 2019-04-19 NOTE — ED Triage Notes (Signed)
Patient complaining of lower abdominal pain and vomiting since yesterday. States she has history of kidney infection.

## 2019-04-19 NOTE — Transfer of Care (Signed)
Immediate Anesthesia Transfer of Care Note  Patient: Chloe Wall  Procedure(s) Performed: APPENDECTOMY LAPAROSCOPIC (N/A )  Patient Location: PACU  Anesthesia Type:General  Level of Consciousness: awake, alert  and oriented  Airway & Oxygen Therapy: Patient Spontanous Breathing and Patient connected to nasal cannula oxygen  Post-op Assessment: Report given to RN and Post -op Vital signs reviewed and stable  Post vital signs: Reviewed and stable  Last Vitals:  Vitals Value Taken Time  BP 134/58 04/19/19 2102  Temp    Pulse 86 04/19/19 2107  Resp 15 04/19/19 2107  SpO2 97 % 04/19/19 2107  Vitals shown include unvalidated device data.  Last Pain:  Vitals:   04/19/19 1845  TempSrc: Oral  PainSc:          Complications: No apparent anesthesia complications

## 2019-04-19 NOTE — ED Provider Notes (Signed)
[facility] EMERGENCY DEPARTMENT Provider Note   CSN: [id number] Arrival date & time: 04/19/19  1148     History Chief Complaint  Patient presents with  . Abdominal Pain    Chloe Wall is a 59 y.o. female with a past medical history of hypertension, pyelonephritis, mitral valve prolapse who presents today for evaluation of right-sided lower abdominal pain.  She reports that in the past when she has had pyelonephritis it has presented with lower abdominal pain.  She reports her symptoms started last night.  She states that she has been consistently vomiting throughout the night every 3 to 7 minutes.  She reports that she had 5 bowel movements however no significant diarrhea.  She denies any fevers or known Covid contacts.  She denies any dysuria, increased frequency or urgency.  She states that she has never had any prior abdominal surgeries.  Pain is made worse with palpation.  Made better with being still.  HPI     Past Medical History:  Diagnosis Date  . High cholesterol   . Hypertension   . Kidney infection   . Mitral valve prolapse     There are no problems to display for this patient.   Past Surgical History:  Procedure Laterality Date  . TONSILLECTOMY       OB History   No obstetric history on file.     History reviewed. No pertinent family history.  Social History   Tobacco Use  . Smoking status: Current Every Day Smoker    Types: Cigarettes  . Smokeless tobacco: Never Used  Substance Use Topics  . Alcohol use: Never  . Drug use: Never    Home Medications Prior to Admission medications   Medication Sig Start Date End Date Taking? Authorizing Provider  amLODipine (NORVASC) 10 MG tablet Take 10 mg by mouth daily. 02/14/19  Yes [provider]  aspirin EC 81 MG tablet Take 81 mg by mouth daily.   Yes [provider]  Cholecalciferol (VITAMIN D) 50 MCG (2000 UT) tablet Take 2,000 Units by mouth daily.   Yes [provider]    metoprolol succinate (TOPROL-XL) 25 MG 24 hr tablet Take 25 mg by mouth daily. 12/25/18  Yes [provider]  rosuvastatin (CRESTOR) 10 MG tablet Take 10 mg by mouth daily. 02/14/19  Yes [provider]  spironolactone (ALDACTONE) 25 MG tablet Take 25 mg by mouth daily.   Yes [provider]    Allergies    Penicillins, Codeine, and Sulfa antibiotics  Review of Systems   Review of Systems  Constitutional: Negative for chills and fever.  HENT: Negative for congestion.   Eyes: Negative for visual disturbance.  Respiratory: Negative for chest tightness and shortness of breath.   Cardiovascular: Negative for chest pain.  Gastrointestinal: Positive for abdominal pain, nausea and vomiting. Negative for diarrhea.  Genitourinary: Negative for dysuria, frequency, hematuria and urgency.  Musculoskeletal: Negative for back pain and neck pain.  Skin: Negative for color change, rash and wound.  Neurological: Negative for headaches.  Psychiatric/Behavioral: Negative for confusion.  All other systems reviewed and are negative.   Physical Exam Updated Vital Signs BP (!) 148/53   Pulse 73   Temp 98.5 F (36.9 C) (Oral)   Resp 17   Ht 5' 4"  (1.626 m)   Wt 66.7 kg   SpO2 98%   BMI 25.23 kg/m   Physical Exam Vitals and nursing note reviewed.  Constitutional:      Appearance: She  is well-developed. She is ill-appearing (Holding vomit bag). She is not diaphoretic.  HENT:     Head: Normocephalic and atraumatic.     Mouth/Throat:     Mouth: Mucous membranes are moist.  Eyes:     General: No scleral icterus.       Right eye: No discharge.        Left eye: No discharge.     Conjunctiva/sclera: Conjunctivae normal.  Cardiovascular:     Rate and Rhythm: Normal rate and regular rhythm.     Heart sounds: Normal heart sounds.  Pulmonary:     Effort: Pulmonary effort is normal. No respiratory distress.     Breath sounds: No stridor.  Abdominal:     General: There  is no distension.     Tenderness: There is abdominal tenderness in the right lower quadrant.  Musculoskeletal:        General: No deformity.     Cervical back: Normal range of motion.  Skin:    General: Skin is warm and dry.  Neurological:     General: No focal deficit present.     Mental Status: She is alert.     Cranial Nerves: No cranial nerve deficit.     Motor: No abnormal muscle tone.  Psychiatric:        Mood and Affect: Mood normal.        Behavior: Behavior normal.     ED Results / Procedures / Treatments   Labs (all labs ordered are listed, but only abnormal results are displayed) Labs Reviewed  COMPREHENSIVE METABOLIC PANEL - Abnormal; Notable for the following components:      Result Value   Glucose, Bld 138 (*)    Total Protein 8.3 (*)    All other components within normal limits  CBC WITH DIFFERENTIAL/PLATELET - Abnormal; Notable for the following components:   WBC 15.0 (*)    Hemoglobin 15.5 (*)    HCT 47.8 (*)    Neutro Abs 13.4 (*)    All other components within normal limits  URINALYSIS, ROUTINE W REFLEX MICROSCOPIC - Abnormal; Notable for the following components:   APPearance HAZY (*)    Hgb urine dipstick LARGE (*)    Ketones, ur 20 (*)    Protein, ur 100 (*)    Bacteria, UA MANY (*)    All other components within normal limits  LACTIC ACID, PLASMA - Abnormal; Notable for the following components:   Lactic Acid, Venous 2.4 (*)    All other components within normal limits  CULTURE, BLOOD (ROUTINE X 2)  CULTURE, BLOOD (ROUTINE X 2)  URINE CULTURE  SARS CORONAVIRUS 2 (TAT 6-24 HRS)  RESPIRATORY PANEL BY RT PCR (FLU A&B, COVID)  LIPASE, BLOOD  LACTIC ACID, PLASMA  APTT  PROTIME-INR  POC SARS CORONAVIRUS 2 AG -  ED  TROPONIN I (HIGH SENSITIVITY)  TROPONIN I (HIGH SENSITIVITY)    EKG EKG Interpretation  Date/Time:  Tuesday April 19 2019 13:04:34 EST Ventricular Rate:  62 PR Interval:    QRS Duration: 88 QT Interval:  419 QTC  Calculation: 426 R Axis:   77 Text Interpretation: Sinus rhythm Nonspecific T abnormalities, anterior leads Confirmed by Virgel Manifold [phone]) on 04/19/2019 2:43:09 PM   Radiology CT ABDOMEN PELVIS W CONTRAST  Addendum Date: 04/19/2019   ADDENDUM REPORT: 04/19/2019 16:54 ADDENDUM: Results were discussed with Dr. Milton Ferguson at 4:53 p.m. [place] on April 19, 2019. Electronically Signed   By: Virgina Norfolk M.D.   On: 04/19/2019  16:54   Result Date: 04/19/2019 CLINICAL DATA:  Right lower quadrant pain. EXAM: CT ABDOMEN AND PELVIS WITH CONTRAST TECHNIQUE: Multidetector CT imaging of the abdomen and pelvis was performed using the standard protocol following bolus administration of intravenous contrast. CONTRAST:  135m OMNIPAQUE IOHEXOL 300 MG/ML  SOLN COMPARISON:  None. FINDINGS: Lower chest: No acute abnormality. Hepatobiliary: No focal liver abnormality is seen. No gallstones, gallbladder wall thickening, or biliary dilatation. Pancreas: Unremarkable. No pancreatic ductal dilatation or surrounding inflammatory changes. Spleen: Normal in size without focal abnormality. Adrenals/Urinary Tract: Adrenal glands are unremarkable. Kidneys are normal, without renal calculi or hydronephrosis. A 9 mm simple cyst is seen within the posterolateral aspect of the mid right kidney. Bladder is unremarkable. Stomach/Bowel: Stomach is within normal limits. A dilated (1.3 cm) fluid-filled appendix is seen. There is a mild amount of periappendiceal inflammatory fat stranding. A 1.0 cm appendicolith is noted within the base of the appendix. Vascular/Lymphatic: There is moderate severity atherosclerosis. No enlarged abdominal or pelvic lymph nodes. Reproductive: Uterus and bilateral adnexa are unremarkable. Other: No abdominal wall hernia or abnormality. No abdominopelvic ascites. Musculoskeletal: No acute or significant osseous findings. IMPRESSION: 1. Appendicitis without evidence of associated perforation or abscess.  Electronically Signed: By: TVirgina NorfolkM.D. On: 04/19/2019 16:48   DG Chest Port 1 View  Result Date: 04/19/2019 CLINICAL DATA:  Sepsis EXAM: PORTABLE CHEST 1 VIEW COMPARISON:  None. FINDINGS: The heart size and mediastinal contours are within normal limits. No focal airspace consolidation, pleural effusion, or pneumothorax. Widened appearance of the right acromioclavicular joint which may be postsurgical or posttraumatic. The visualized skeletal structures are otherwise unremarkable. IMPRESSION: No acute cardiopulmonary findings. Electronically Signed   By: NDavina PokeD.O.   On: 04/19/2019 14:43    Procedures Procedures (including critical care time)  Medications Ordered in ED Medications  levofloxacin (LEVAQUIN) IVPB 750 mg (has no administration in time range)  sodium chloride 0.9 % bolus 1,000 mL (0 mLs Intravenous Stopped 04/19/19 1402)  promethazine (PHENERGAN) injection 12.5 mg (12.5 mg Intravenous Given 04/19/19 1253)  morphine 4 MG/ML injection 4 mg (4 mg Intravenous Given 04/19/19 1255)  sodium chloride 0.9 % bolus 1,500 mL (0 mLs Intravenous Stopped 04/19/19 1515)  levofloxacin (LEVAQUIN) IVPB 750 mg (750 mg Intravenous New Bag/Given 04/19/19 1516)  metroNIDAZOLE (FLAGYL) IVPB 500 mg (0 mg Intravenous Stopped 04/19/19 1631)  iohexol (OMNIPAQUE) 300 MG/ML solution 100 mL (100 mLs Intravenous Contrast Given 04/19/19 1615)    ED Course  I have reviewed the triage vital signs and the nursing notes.  Pertinent labs & imaging results that were available during my care of the patient were reviewed by me and considered in my medical decision making (see chart for details).  Clinical Course as of Apr 18 1720  Tue Apr 19, 2019  1347 White count elevated at 15. Still unclear if uti vs intraabdominal.  Will start with aztreonam and add on coverage if needed.   Pulse Rate(!): 107 [EH]  1436 Patient updated, she needs to use restroom.  Reports feeling better.    [EH]    Clinical Course  User Index [EH] HLorin Glass PA-C   MDM Rules/Calculators/A&P                     LCharyl Dancerpresents today for evaluation of right lower quadrant abdominal pain since last night.  She has a history of pyelonephritis and initially suspected this.  On exam she had localized tenderness to palpation in  the right lower quadrant.  On arrival she met SIRS criteria with tachycardia and leukocytosis.    Labs show leukocytosis of 15 with hemoglobin of 15.5 concerning for dehydration.  Covid antigen test was negative.  CMP is unremarkable.  UA concerning for infection with many bacteria, 6-7 white cells, 11-20 red cells and 6-10 squamous epithelial cells. I had spoke with pharmacy about antibiotic choices, given patient's allergies, that would cover both [place] and appendicitis/UTI prior to CT scan results, and Levaquin and Flagyl were ordered.  Her pain was treated with morphine, her nausea was treated with Phenergan, and she was given 30 mill per kilo fluid bolus.  CT scan abdomen pelvis was obtained showing appendicitis.  I spoke with Dr. Arnoldo Morale of general surgery who agreed to see the patient for admission.  2-hour Covid test was ordered.  Chloe Wall was evaluated in Emergency Department on 04/19/2019 for the symptoms described in the history of present illness. She was evaluated in the context of the global COVID-19 pandemic, which necessitated consideration that the patient might be at risk for infection with the SARS-CoV-2 virus that causes COVID-19. Institutional protocols and algorithms that pertain to the evaluation of patients at risk for COVID-19 are in a state of rapid change based on information released by regulatory bodies including the CDC and federal and state organizations. These policies and algorithms were followed during the patient's care in the ED.  This patient was seen as a shared visit with Dr. Wilson Singer.   Note: Portions of this report may have been transcribed using  voice recognition software. Every effort was made to ensure accuracy; however, inadvertent computerized transcription errors may be present  Final Clinical Impression(s) / ED Diagnoses Final diagnoses:  Acute appendicitis with localized peritonitis, without perforation, abscess, or gangrene  Pyelonephritis    Rx / DC Orders ED Discharge Orders    None       Ollen Gross 04/19/19 2235    Virgel Manifold, MD 04/22/19 1020

## 2019-04-20 LAB — BASIC METABOLIC PANEL
Anion gap: 11 (ref 5–15)
BUN: 18 mg/dL (ref 6–20)
CO2: 21 mmol/L — ABNORMAL LOW (ref 22–32)
Calcium: 9.3 mg/dL (ref 8.9–10.3)
Chloride: 106 mmol/L (ref 98–111)
Creatinine, Ser: 1.06 mg/dL — ABNORMAL HIGH (ref 0.44–1.00)
GFR calc Af Amer: >60 mL/min (ref >60)
GFR calc non Af Amer: 58 mL/min — ABNORMAL LOW (ref >60)
Glucose, Bld: 143 mg/dL — ABNORMAL HIGH (ref 70–99)
Potassium: 3.9 mmol/L (ref 3.5–5.1)
Sodium: 138 mmol/L (ref 135–145)

## 2019-04-20 LAB — CBC
HCT: 41.5 % (ref 36.0–46.0)
Hemoglobin: 13.4 g/dL (ref 12.0–15.0)
MCH: 32.4 pg (ref 26.0–34.0)
MCHC: 32.3 g/dL (ref 30.0–36.0)
MCV: 100.2 fL — ABNORMAL HIGH (ref 80.0–100.0)
Platelets: 129 10*3/uL — ABNORMAL LOW (ref 150–400)
RBC: 4.14 MIL/uL (ref 3.87–5.11)
RDW: 13.3 % (ref 11.5–15.5)
WBC: 11.1 10*3/uL — ABNORMAL HIGH (ref 4.0–10.5)
nRBC: 0 % (ref 0.0–0.2)

## 2019-04-20 LAB — URINE CULTURE

## 2019-04-20 LAB — SARS CORONAVIRUS 2 (TAT 6-24 HRS): SARS Coronavirus 2: NEGATIVE

## 2019-04-20 MED ORDER — LEVOFLOXACIN 500 MG PO TABS: 500.0000 mg | ORAL_TABLET | Freq: Every day | ORAL | 0 refills | Status: AC

## 2019-04-20 MED ORDER — TRAMADOL HCL 50 MG PO TABS: 50.0000 mg | ORAL_TABLET | Freq: Four times a day (QID) | ORAL | 0 refills | Status: AC | PRN

## 2019-04-20 NOTE — Discharge Summary (Signed)
Physician Discharge Summary  Patient ID: RHODA WALDVOGEL MRN: [id number] DOB/AGE: [birth year] 59 y.o.  Admit date: 04/19/2019 Discharge date: 04/20/2019  Admission Diagnoses: Acute appendicitis, urinary tract infection  Discharge Diagnoses: Same Active Problems:   Acute appendicitis with localized peritonitis, without perforation, abscess, or gangrene   Acute appendicitis   Discharged Condition: good  Hospital Course: Patient is a 59 year old white female who presented to the emergency room with a 24-hour history of worsening right lower quadrant abdominal pain.  CT scan of the abdomen revealed acute appendicitis.  She also is a history of recurrent UTIs.  She underwent laparoscopic appendectomy on April 19, 2019.  A near gangrenous appendix was found.  She tolerated surgery well.  Her postoperative course has been unremarkable.  Her diet was advanced on difficulty.  The patient is being discharged home on April 20, 2019 in good and improving condition.  Treatments: surgery: Laparoscopic appendectomy on April 19, 2019  Discharge Exam: Blood pressure (!) 104/50, pulse 66, temperature 98.1 F (36.7 C), temperature source Oral, resp. rate 18, height 5\' 4"  (1.626 m), weight 66.7 kg, SpO2 98 %. General appearance: alert, cooperative and no distress Resp: clear to auscultation bilaterally Cardio: regular rate and rhythm, S1, S2 normal, no murmur, click, rub or gallop GI: Soft, incisions healing well.  Disposition: Discharge disposition: 01-Home or Self Care       Discharge Instructions    Diet - low sodium heart healthy   Complete by: As directed    Increase activity slowly   Complete by: As directed      Allergies as of 04/20/2019      Reactions   Penicillins Anaphylaxis   Did it involve swelling of the face/tongue/throat, SOB, or low BP? Yes Did it involve sudden or severe rash/hives, skin peeling, or any reaction on the inside of your mouth or nose? Yes Did you need to seek  medical attention at a hospital or doctor's office? Yes When did it last happen? If all above answers are "NO", may proceed with cephalosporin use.   Codeine Other (See Comments)   Abdominal pain, vomiting   Sulfa Antibiotics Nausea And Vomiting      Medication List    TAKE these medications   amLODipine 10 MG tablet Commonly known as: NORVASC Take 10 mg by mouth daily.   aspirin EC 81 MG tablet Take 81 mg by mouth daily.   levofloxacin 500 MG tablet Commonly known as: Levaquin Take 1 tablet (500 mg total) by mouth daily for 10 days.   metoprolol succinate 25 MG 24 hr tablet Commonly known as: TOPROL-XL Take 25 mg by mouth daily.   rosuvastatin 10 MG tablet Commonly known as: CRESTOR Take 10 mg by mouth daily.   spironolactone 25 MG tablet Commonly known as: ALDACTONE Take 25 mg by mouth daily.   traMADol 50 MG tablet Commonly known as: Ultram Take 1 tablet (50 mg total) by mouth every 6 (six) hours as needed.   Vitamin D 50 MCG (2000 UT) tablet Take 2,000 Units by mouth daily.      Follow-up Information    06/18/2019, MD Follow up.   Specialty: General Surgery Why: Will call you next week for follow up Contact information: [address] [place] [phone]           Signed: [phone] 04/20/2019, 10:16 AM

## 2019-04-20 NOTE — Progress Notes (Signed)
IV removed, patient tolerated well. Reviewed AVS with patient and patient's husband, both verbalized understanding.  Patient taken to short stay lobby via wheelchair and transported home by her husband.

## 2019-04-20 NOTE — Discharge Instructions (Signed)
Laparoscopic Appendectomy, Adult, Care After This sheet gives you information about how to care for yourself after your procedure. Your health care provider may also give you more specific instructions. If you have problems or questions, contact your health care provider. What can I expect after the procedure? After the procedure, it is common to have:  Little energy for normal activities.  Mild pain in the area where the incisions were made.  Difficulty passing stool (constipation). This can be caused by: ? Pain medicine. ? A decrease in your activity. Follow these instructions at home: Medicines  Take over-the-counter and prescription medicines only as told by your health care provider.  If you were prescribed an antibiotic medicine, take it as told by your health care provider. Do not stop taking the antibiotic even if you start to feel better.  Do not drive or use heavy machinery while taking prescription pain medicine.  Ask your health care provider if the medicine prescribed to you can cause constipation. You may need to take steps to prevent or treat constipation, such as: ? Drink enough fluid to keep your urine pale yellow. ? Take over-the-counter or prescription medicines. ? Eat foods that are high in fiber, such as beans, whole grains, and fresh fruits and vegetables. ? Limit foods that are high in fat and processed sugars, such as fried or sweet foods. Incision care   Follow instructions from your health care provider about how to take care of your incisions. Make sure you: ? Wash your hands with soap and water before and after you change your bandage (dressing). If soap and water are not available, use hand sanitizer. ? Change your dressing as told by your health care provider. ? Leave stitches (sutures), skin glue, or adhesive strips in place. These skin closures may need to stay in place for 2 weeks or longer. If adhesive strip edges start to loosen and curl up, you may  trim the loose edges. Do not remove adhesive strips completely unless your health care provider tells you to do that.  Check your incision areas every day for signs of infection. Check for: ? Redness, swelling, or pain. ? Fluid or blood. ? Warmth. ? Pus or a bad smell. Bathing  Keep your incisions clean and dry. Clean them as often as told by your health care provider. To do this: 1. Gently wash the incisions with soap and water. 2. Rinse the incisions with water to remove all soap. 3. Pat the incisions dry with a clean towel. Do not rub the incisions.  Do not take baths, swim, or use a hot tub for 2 weeks, or until your health care provider approves. You may take showers after 48 hours. Activity   Do not drive for 24 hours if you were given a sedative during your procedure.  Rest after the procedure. Return to your normal activities as told by your health care provider. Ask your health care provider what activities are safe for you.  For 3 weeks, or for as long as told by your health care provider: ? Do not lift anything that is heavier than 10 lb (4.5 kg), or the limit that you are told. ? Do not play contact sports. General instructions  If you were sent home with a drain, follow instructions from your health care provider about how to care for it.  Take deep breaths. This helps to prevent your lungs from developing an infection (pneumonia).  Keep all follow-up visits as told by your health   care provider. This is important. Contact a health care provider if:  You have redness, swelling, or pain around an incision.  You have fluid or blood coming from an incision.  Your incision feels warm to the touch.  You have pus or a bad smell coming from an incision or dressing.  Your incision edges break open after your sutures have been removed.  You have increasing pain in your shoulders.  You feel dizzy or you faint.  You develop shortness of breath.  You keep feeling  nauseous or you are vomiting.  You have diarrhea or you cannot control your bowel functions.  You lose your appetite.  You develop swelling or pain in your legs.  You develop a rash. Get help right away if you have:  A fever.  Difficulty breathing.  Sharp pains in your chest. Summary  After a laparoscopic appendectomy, it is common to have little energy for normal activities, mild pain in the area of the incisions, and constipation.  Infection is the most common complication after this procedure. Follow your health care provider's instructions about caring for yourself after the procedure.  Rest after the procedure. Return to your normal activities as told by your health care provider.  Contact your health care provider if you notice signs of infection around your incisions or you develop shortness of breath. Get help right away if you have a fever, chest pain, or difficulty breathing. This information is not intended to replace advice given to you by your health care provider. Make sure you discuss any questions you have with your health care provider. Document Revised: 10/01/2017 Document Reviewed: 10/01/2017 Elsevier Patient Education  2020 [employer]  

## 2019-04-21 LAB — SURGICAL PATHOLOGY

## 2019-04-25 LAB — CULTURE, BLOOD (ROUTINE X 2)
Culture: NO GROWTH
Culture: NO GROWTH
Special Requests: ADEQUATE
Special Requests: ADEQUATE

## 2019-04-28 ENCOUNTER — Telehealth (INDEPENDENT_AMBULATORY_CARE_PROVIDER_SITE_OTHER): Payer: Self-pay | Admitting: General Surgery

## 2019-04-28 ENCOUNTER — Other Ambulatory Visit: Payer: Self-pay

## 2019-04-28 DIAGNOSIS — Z09 Encounter for follow-up examination after completed treatment for conditions other than malignant neoplasm: Secondary | ICD-10-CM

## 2019-04-28 NOTE — Progress Notes (Signed)
Virtual postoperative visit performed by telephone.  Patient states she is doing well.  Her energy level has improved.  She has not had any fevers over the past 36 hours.  She is pleased with the results.  She will be finishing her antibiotics which will also cover her urinary tract infection.  She was instructed to follow-up with me as needed.

## 2019-06-07 DIAGNOSIS — I10 Essential (primary) hypertension: Secondary | ICD-10-CM | POA: Diagnosis not present

## 2019-06-29 DIAGNOSIS — Z23 Encounter for immunization: Secondary | ICD-10-CM | POA: Diagnosis not present

## 2019-07-25 DIAGNOSIS — Z08 Encounter for follow-up examination after completed treatment for malignant neoplasm: Secondary | ICD-10-CM | POA: Diagnosis not present

## 2019-07-25 DIAGNOSIS — Z85828 Personal history of other malignant neoplasm of skin: Secondary | ICD-10-CM | POA: Diagnosis not present

## 2019-07-25 DIAGNOSIS — D235 Other benign neoplasm of skin of trunk: Secondary | ICD-10-CM | POA: Diagnosis not present

## 2019-07-25 DIAGNOSIS — D485 Neoplasm of uncertain behavior of skin: Secondary | ICD-10-CM | POA: Diagnosis not present

## 2019-07-25 DIAGNOSIS — Z1283 Encounter for screening for malignant neoplasm of skin: Secondary | ICD-10-CM | POA: Diagnosis not present

## 2019-07-25 DIAGNOSIS — D3617 Benign neoplasm of peripheral nerves and autonomic nervous system of trunk, unspecified: Secondary | ICD-10-CM | POA: Diagnosis not present

## 2019-07-25 DIAGNOSIS — D225 Melanocytic nevi of trunk: Secondary | ICD-10-CM | POA: Diagnosis not present

## 2019-07-27 DIAGNOSIS — Z23 Encounter for immunization: Secondary | ICD-10-CM | POA: Diagnosis not present

## 2019-08-04 DIAGNOSIS — D485 Neoplasm of uncertain behavior of skin: Secondary | ICD-10-CM | POA: Diagnosis not present

## 2019-08-04 DIAGNOSIS — L988 Other specified disorders of the skin and subcutaneous tissue: Secondary | ICD-10-CM | POA: Diagnosis not present

## 2019-10-06 DIAGNOSIS — Z0000 Encounter for general adult medical examination without abnormal findings: Secondary | ICD-10-CM | POA: Diagnosis not present

## 2019-10-10 DIAGNOSIS — E039 Hypothyroidism, unspecified: Secondary | ICD-10-CM | POA: Diagnosis not present

## 2019-10-10 DIAGNOSIS — D649 Anemia, unspecified: Secondary | ICD-10-CM | POA: Diagnosis not present

## 2019-11-24 DIAGNOSIS — Z1231 Encounter for screening mammogram for malignant neoplasm of breast: Secondary | ICD-10-CM | POA: Diagnosis not present

## 2020-01-03 DIAGNOSIS — Z780 Asymptomatic menopausal state: Secondary | ICD-10-CM | POA: Diagnosis not present

## 2020-01-03 DIAGNOSIS — M8588 Other specified disorders of bone density and structure, other site: Secondary | ICD-10-CM | POA: Diagnosis not present

## 2020-02-07 DIAGNOSIS — I10 Essential (primary) hypertension: Secondary | ICD-10-CM | POA: Diagnosis not present

## 2020-02-07 DIAGNOSIS — Z23 Encounter for immunization: Secondary | ICD-10-CM | POA: Diagnosis not present

## 2020-03-15 DIAGNOSIS — J040 Acute laryngitis: Secondary | ICD-10-CM | POA: Diagnosis not present

## 2020-03-15 DIAGNOSIS — R062 Wheezing: Secondary | ICD-10-CM | POA: Diagnosis not present

## 2020-06-07 DIAGNOSIS — I10 Essential (primary) hypertension: Secondary | ICD-10-CM | POA: Diagnosis not present

## 2020-07-23 DIAGNOSIS — D485 Neoplasm of uncertain behavior of skin: Secondary | ICD-10-CM | POA: Diagnosis not present

## 2020-07-23 DIAGNOSIS — D225 Melanocytic nevi of trunk: Secondary | ICD-10-CM | POA: Diagnosis not present

## 2020-07-23 DIAGNOSIS — Z1283 Encounter for screening for malignant neoplasm of skin: Secondary | ICD-10-CM | POA: Diagnosis not present

## 2020-08-16 DIAGNOSIS — D485 Neoplasm of uncertain behavior of skin: Secondary | ICD-10-CM | POA: Diagnosis not present

## 2020-08-16 DIAGNOSIS — L988 Other specified disorders of the skin and subcutaneous tissue: Secondary | ICD-10-CM | POA: Diagnosis not present

## 2020-09-25 DIAGNOSIS — I10 Essential (primary) hypertension: Secondary | ICD-10-CM | POA: Diagnosis not present

## 2020-10-12 DIAGNOSIS — R52 Pain, unspecified: Secondary | ICD-10-CM | POA: Diagnosis not present

## 2020-10-12 DIAGNOSIS — K824 Cholesterolosis of gallbladder: Secondary | ICD-10-CM | POA: Diagnosis not present

## 2020-10-12 DIAGNOSIS — I700 Atherosclerosis of aorta: Secondary | ICD-10-CM | POA: Diagnosis not present

## 2020-10-12 DIAGNOSIS — K819 Cholecystitis, unspecified: Secondary | ICD-10-CM | POA: Diagnosis not present

## 2020-10-12 DIAGNOSIS — N281 Cyst of kidney, acquired: Secondary | ICD-10-CM | POA: Diagnosis not present

## 2021-01-27 DIAGNOSIS — J069 Acute upper respiratory infection, unspecified: Secondary | ICD-10-CM | POA: Diagnosis not present

## 2021-01-27 DIAGNOSIS — R062 Wheezing: Secondary | ICD-10-CM | POA: Diagnosis not present

## 2021-01-27 DIAGNOSIS — R300 Dysuria: Secondary | ICD-10-CM | POA: Diagnosis not present

## 2021-01-27 DIAGNOSIS — Z20822 Contact with and (suspected) exposure to covid-19: Secondary | ICD-10-CM | POA: Diagnosis not present

## 2021-01-27 DIAGNOSIS — N390 Urinary tract infection, site not specified: Secondary | ICD-10-CM | POA: Diagnosis not present

## 2021-01-30 DIAGNOSIS — Z1283 Encounter for screening for malignant neoplasm of skin: Secondary | ICD-10-CM | POA: Diagnosis not present

## 2021-01-30 DIAGNOSIS — B078 Other viral warts: Secondary | ICD-10-CM | POA: Diagnosis not present

## 2021-01-30 DIAGNOSIS — D225 Melanocytic nevi of trunk: Secondary | ICD-10-CM | POA: Diagnosis not present

## 2021-03-05 DIAGNOSIS — E785 Hyperlipidemia, unspecified: Secondary | ICD-10-CM | POA: Diagnosis not present

## 2021-03-05 DIAGNOSIS — Z1239 Encounter for other screening for malignant neoplasm of breast: Secondary | ICD-10-CM | POA: Diagnosis not present

## 2021-03-05 DIAGNOSIS — I10 Essential (primary) hypertension: Secondary | ICD-10-CM | POA: Diagnosis not present

## 2021-03-05 DIAGNOSIS — H9190 Unspecified hearing loss, unspecified ear: Secondary | ICD-10-CM | POA: Diagnosis not present

## 2021-03-05 DIAGNOSIS — N390 Urinary tract infection, site not specified: Secondary | ICD-10-CM | POA: Diagnosis not present

## 2021-03-14 ENCOUNTER — Ambulatory Visit: Payer: BC Managed Care – PPO | Attending: Family Medicine | Admitting: Audiology

## 2021-03-14 ENCOUNTER — Other Ambulatory Visit: Payer: Self-pay

## 2021-03-14 DIAGNOSIS — H9313 Tinnitus, bilateral: Secondary | ICD-10-CM | POA: Diagnosis not present

## 2021-03-14 DIAGNOSIS — H903 Sensorineural hearing loss, bilateral: Secondary | ICD-10-CM | POA: Insufficient documentation

## 2021-03-14 NOTE — Procedures (Signed)
  Outpatient Audiology and [facility] [address] [place], [place]  [address] [phone]  AUDIOLOGICAL  EVALUATION  NAME: Chloe Wall     DOB:   04-30-1960      MRN: [id number]                                                                                     DATE: 03/14/2021     STATUS: Outpatient DIAGNOSIS: Sensorineural hearing loss, bilateral   History: Chloe Wall was seen for an audiological evaluation due to decreased hearing occurring for many years. She was accompanied to the appointment by her husband. Chloe Wall reports she was around a Pharmacologist when she was 36 and that was the start of her hearing loss. She reports her hearing has worsened over the past few years and she subjectively hears better out of her right ear.  Chloe Wall reports bilateral, constant, tinnitus. She denies otalgia, aural fullness, and dizziness. She reports increased difficulty hearing her husband and her grandchildren.   Evaluation:  Otoscopy showed a clear view of the tympanic membranes, bilaterally Tympanometry results were consistent with normal middle ear pressure and normal tympanic membrane mobility, bilaterally.  Audiometric testing was completed using Conventional Audiometry techniques with insert earphones and TDH headphones. Test results are consistent with normal hearing sensitivity sloping to a moderately-severe sensorineural hearing loss, bilaterally. Sensorineural asymmetry noted at 6000 Hz, worse in the left ear. Speech Recognition Thresholds were obtained at 20 dB HL in the right ear and at 15  dB HL in the left ear. Word Recognition Testing was completed at 70 dB HL and Chloe Wall scored 100%, bilaterally.    Results:  Today's results are consistent with normal hearing sensitivity sloping to a moderately-severe sensorineural hearing loss. Chloe Wall will have communication difficulty in many listening environments. She will benefit from the use of good communication strategies. The  test results and recommendations were reviewed with Chloe Wall and her husband. She was given a copy of today's Audiogram and handouts on effective communication strategies.   Recommendations: 1.   Return in 2-3 years for an Audiological Evaluation to monitor hearing sensitivity.  2.   Referral to Ear, Nose, and Throat Physician for sensorineural asymmetry at 6000 Hz if motivated.     If you have any questions please feel free to contact me at [phone].  Marton Redwood Audiologist, Au.D., CCC-A 03/14/2021  2:51 PM

## 2021-04-11 DIAGNOSIS — E538 Deficiency of other specified B group vitamins: Secondary | ICD-10-CM | POA: Diagnosis not present

## 2021-04-11 DIAGNOSIS — I10 Essential (primary) hypertension: Secondary | ICD-10-CM | POA: Diagnosis not present

## 2021-04-11 DIAGNOSIS — E785 Hyperlipidemia, unspecified: Secondary | ICD-10-CM | POA: Diagnosis not present

## 2021-04-12 DIAGNOSIS — Z1231 Encounter for screening mammogram for malignant neoplasm of breast: Secondary | ICD-10-CM | POA: Diagnosis not present

## 2021-04-18 DIAGNOSIS — Z0001 Encounter for general adult medical examination with abnormal findings: Secondary | ICD-10-CM | POA: Diagnosis not present

## 2021-04-18 DIAGNOSIS — R7303 Prediabetes: Secondary | ICD-10-CM | POA: Diagnosis not present

## 2021-10-18 IMAGING — CR DG CHEST 1V PORT
1 series · 2 of 2 positions shown · non-contrast
Comparison: None.

CLINICAL DATA: Sepsis

EXAM:
PORTABLE CHEST 1 VIEW

[Series 1: portable · 0.17mm/px · 2 of 2 slices shown]
[im 1/2]
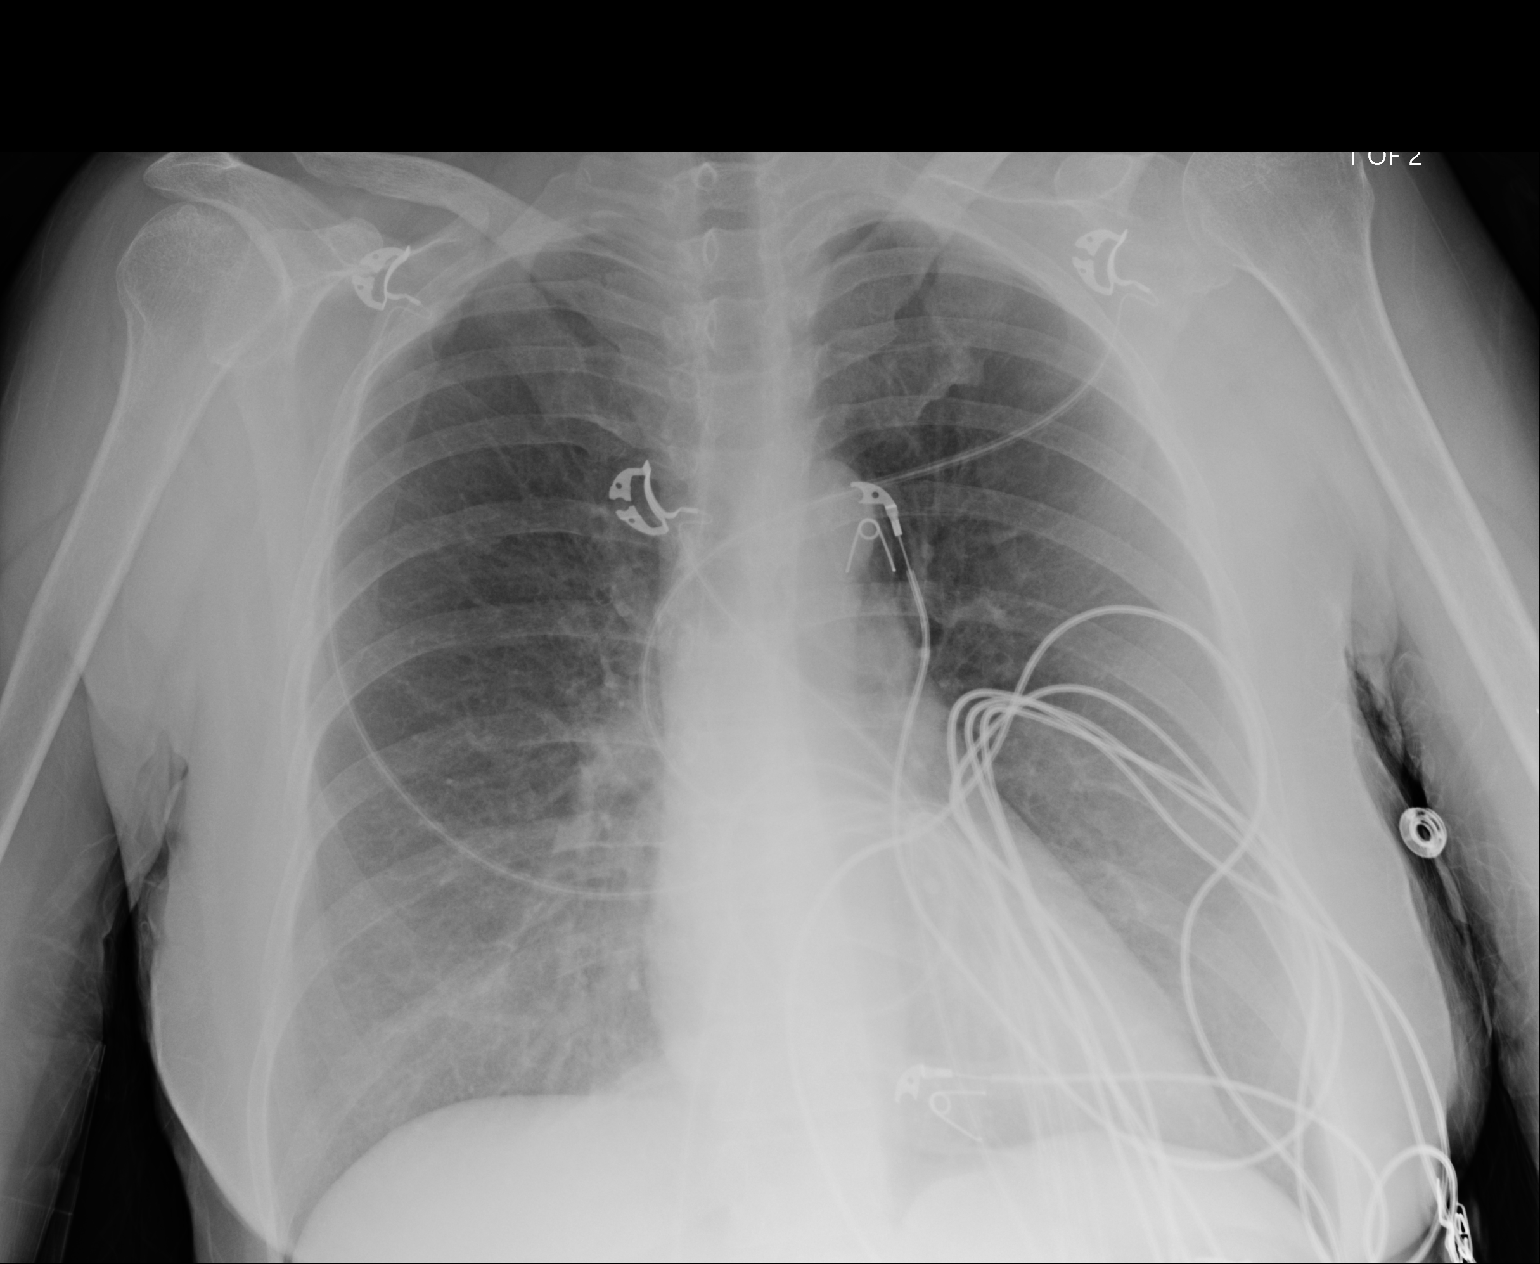
[im 2/2]
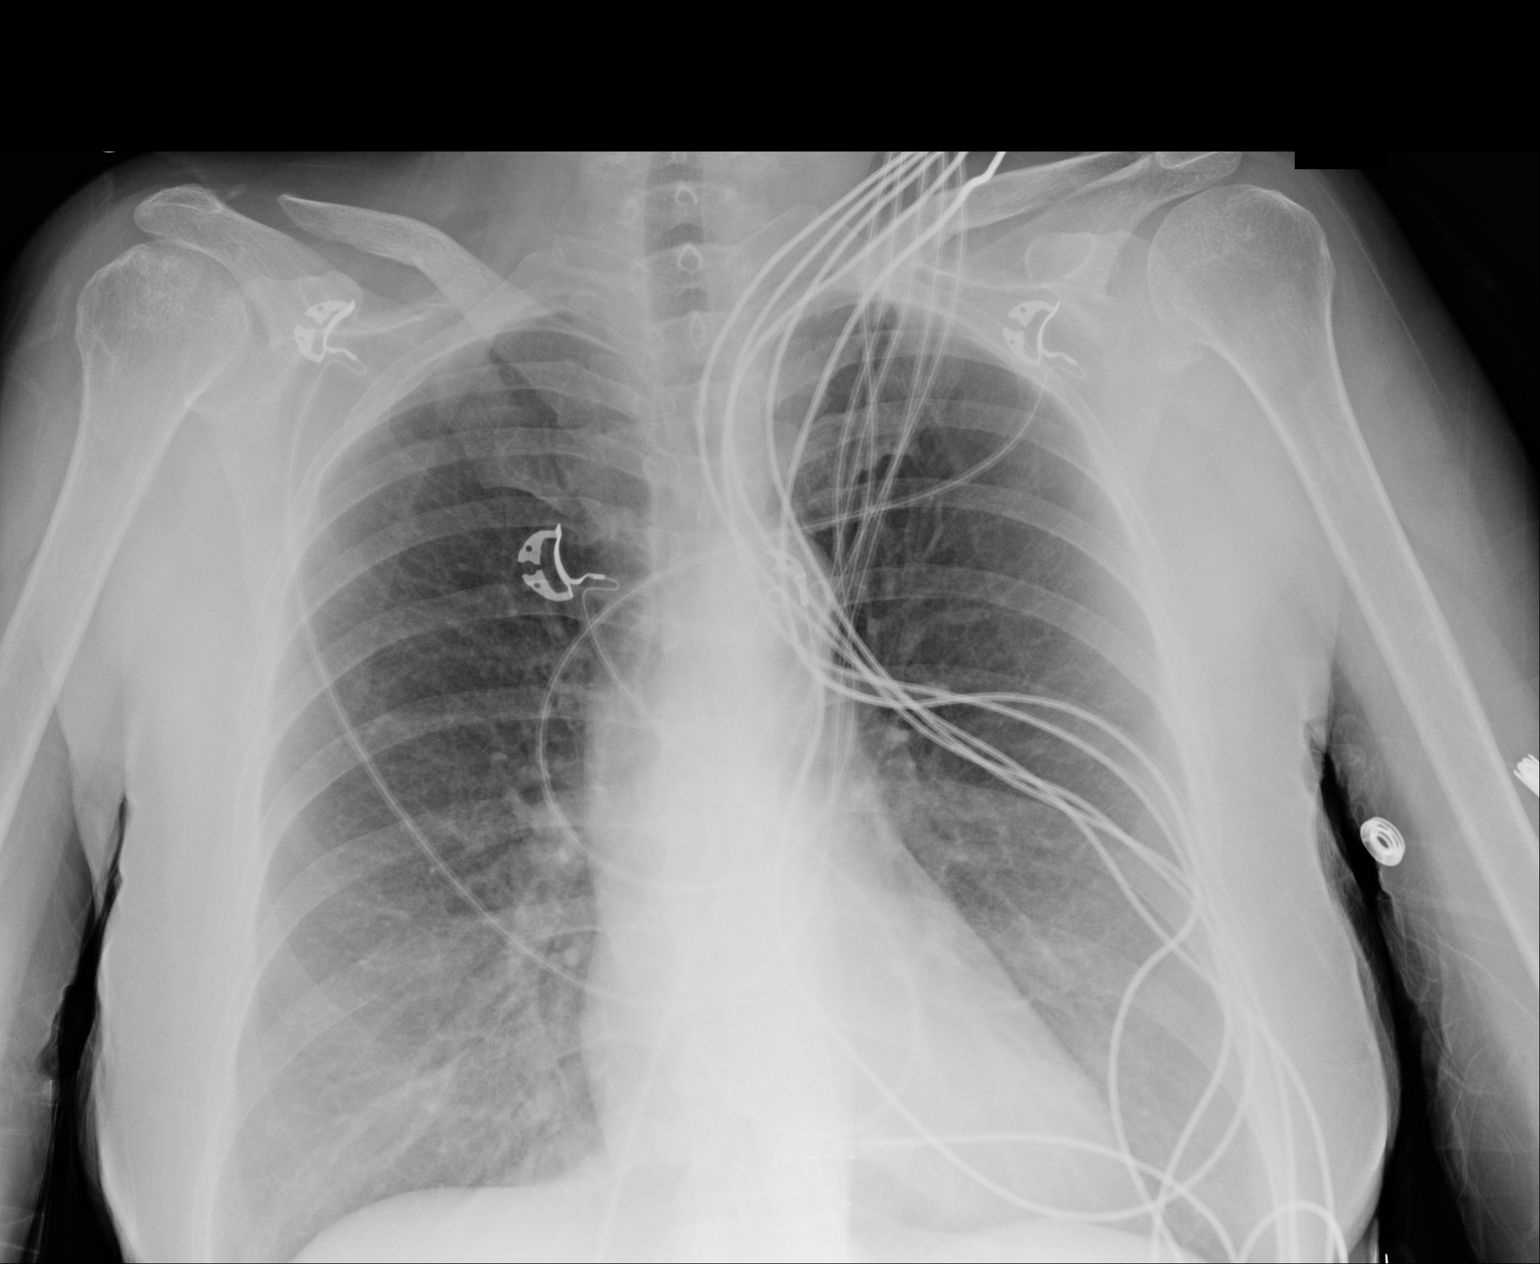

[2 of 2 positions shown; findings below may reference images not displayed]

FINDINGS: The heart size and mediastinal contours are within normal limits. No
focal airspace consolidation, pleural effusion, or pneumothorax.
Widened appearance of the right acromioclavicular joint which may be
postsurgical or posttraumatic. The visualized skeletal structures
are otherwise unremarkable.
IMPRESSION: No acute cardiopulmonary findings.

## 2021-10-18 IMAGING — CT CT ABD-PELV W/ CM
2 of 5 series · 15 of 46 positions shown, 17 images · IV contrast (Omnipaque or Isovue)
Comparison: None.
COMPARISON: None.

Addendum:
CLINICAL DATA: Right lower quadrant pain.

EXAM:
CT ABDOMEN AND PELVIS WITH CONTRAST
TECHNIQUE: Multidetector CT imaging of the abdomen and pelvis was performed
using the standard protocol following bolus administration of
intravenous contrast.
CONTRAST:  100mL OMNIPAQUE IOHEXOL 300 MG/ML  SOLN

[Series 2: axial st · axial · 0.75mm/px · z∈[-706,-321]mm · 12 of 89 slices shown, 14 images]
[im 6/89  soft-tissue]
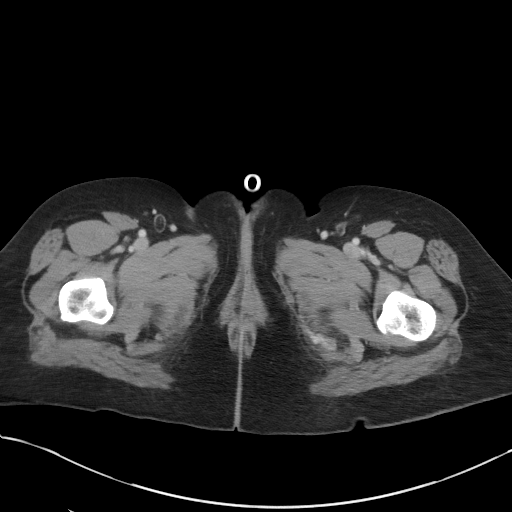
[im 6/89  bone]
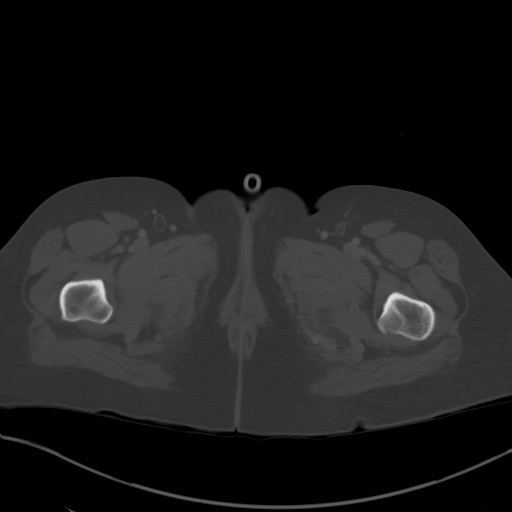
[im 12/89  soft-tissue]
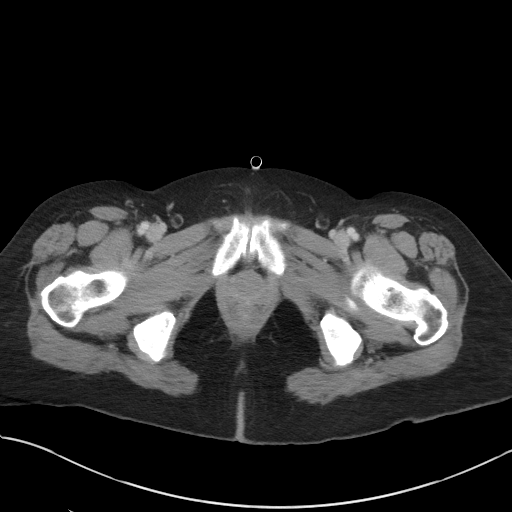
[im 18/89  soft-tissue]
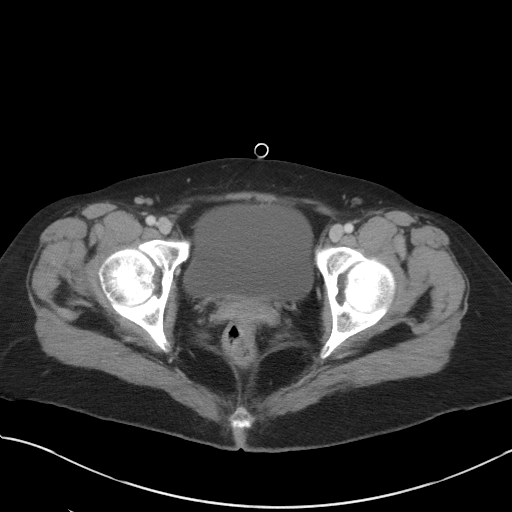
[im 30/89  soft-tissue]
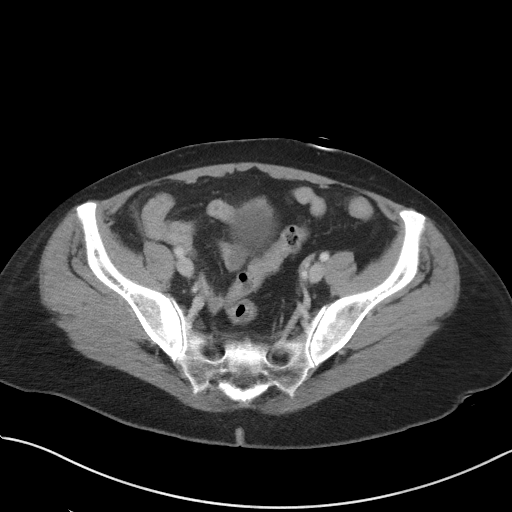
[im 36/89  soft-tissue]
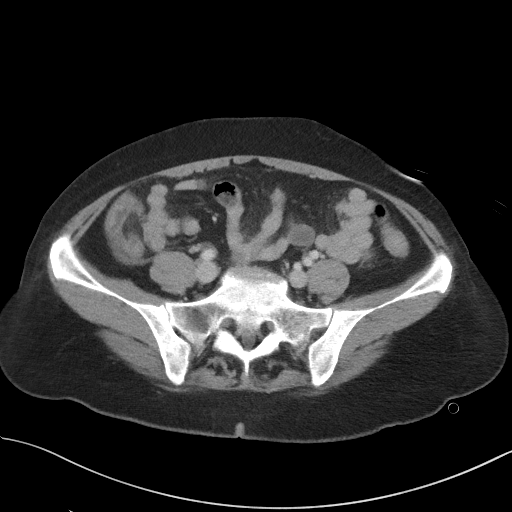
[im 42/89  soft-tissue]
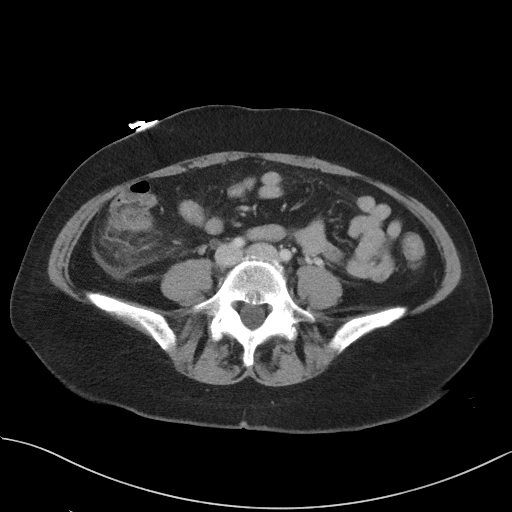
[im 47/89  soft-tissue]
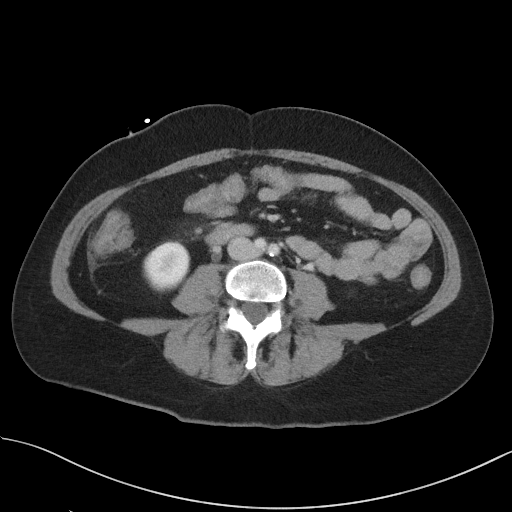
[im 53/89  soft-tissue]
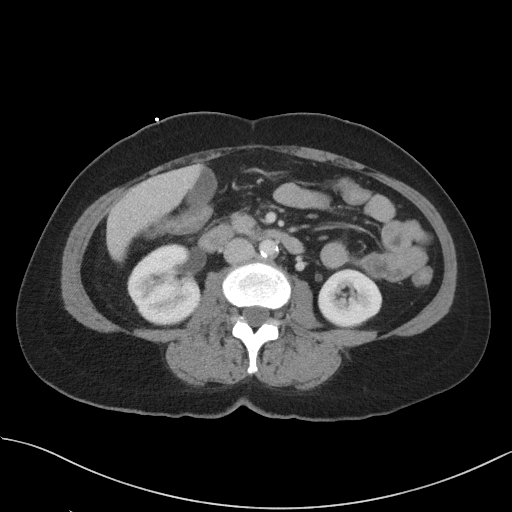
[im 59/89  soft-tissue]
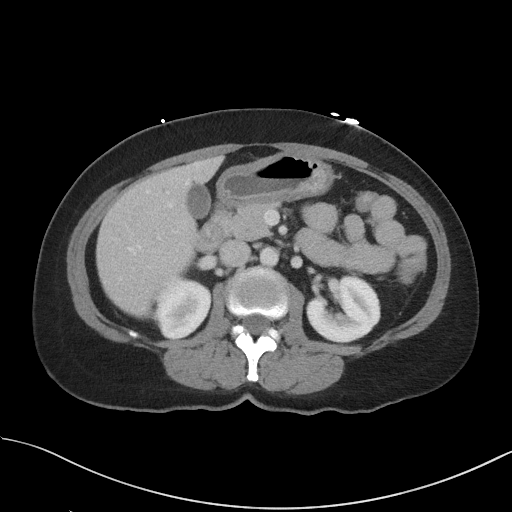
[im 59/89  bone]
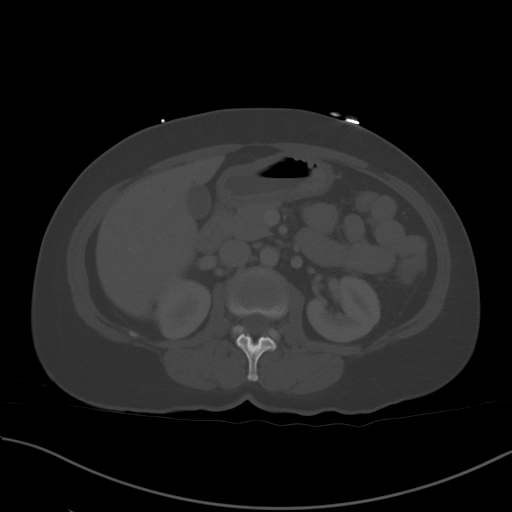
[im 71/89  soft-tissue]
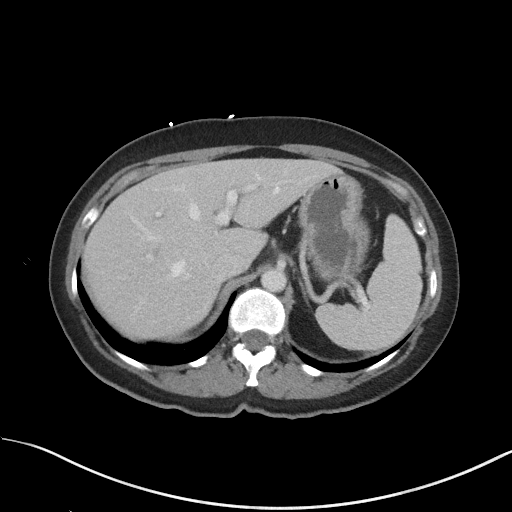
[im 77/89  soft-tissue]
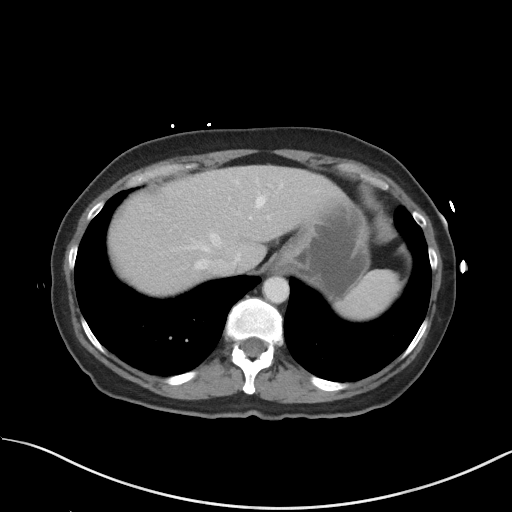
[im 83/89  soft-tissue]
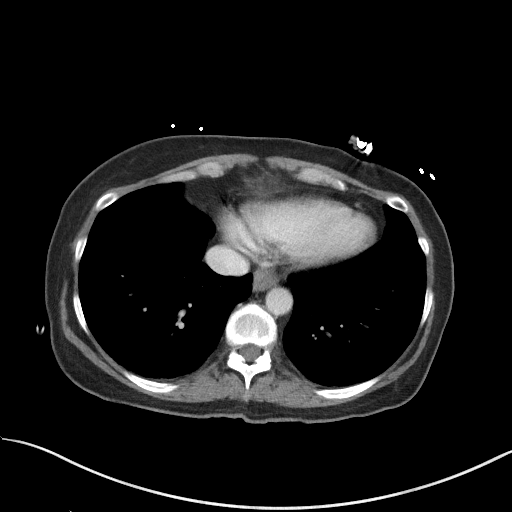

[Series 5: coronal st · coronal · 0.76mm/px · 3 of 87 slices shown]
[im 29/87  soft-tissue]
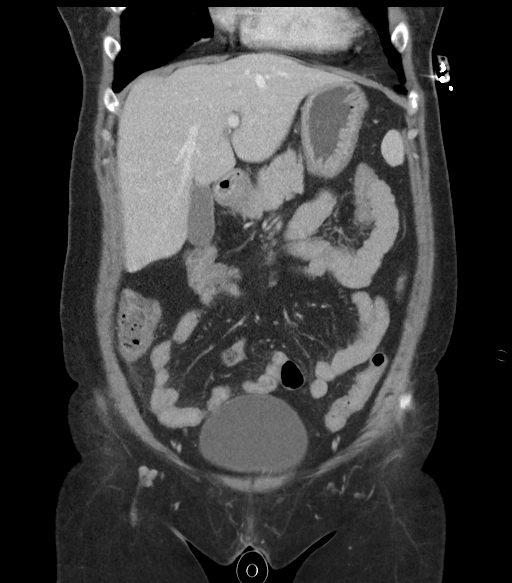
[im 39/87  soft-tissue]
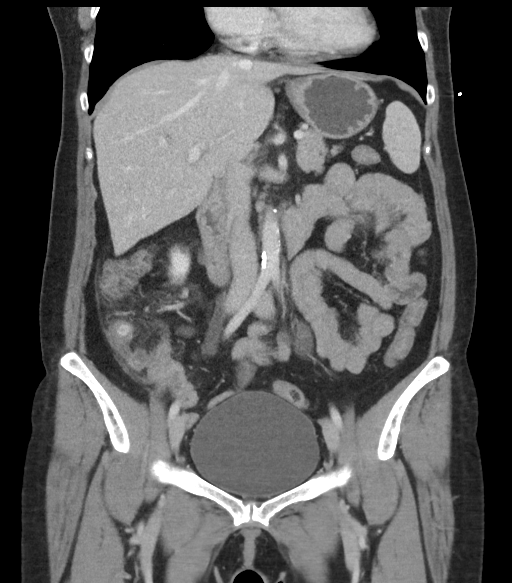
[im 48/87  soft-tissue]
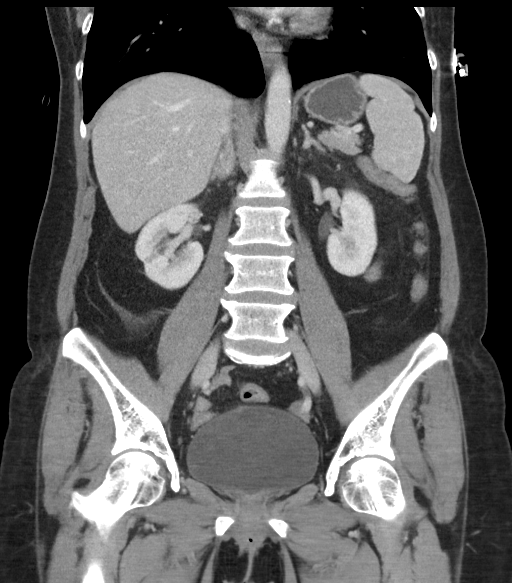

[15 of 46 positions shown; findings below may reference images not displayed]

FINDINGS: Lower chest: No acute abnormality.

Hepatobiliary: No focal liver abnormality is seen. No gallstones,
gallbladder wall thickening, or biliary dilatation.

Pancreas: Unremarkable. No pancreatic ductal dilatation or
surrounding inflammatory changes.

Spleen: Normal in size without focal abnormality.

Adrenals/Urinary Tract: Adrenal glands are unremarkable. Kidneys are
normal, without renal calculi or hydronephrosis. A 9 mm simple cyst
is seen within the posterolateral aspect of the mid right kidney.
Bladder is unremarkable.

Stomach/Bowel: Stomach is within normal limits. A dilated (1.3 cm)
fluid-filled appendix is seen. There is a mild amount of
periappendiceal inflammatory fat stranding. A 1.0 cm appendicolith
is noted within the base of the appendix.

Vascular/Lymphatic: There is moderate severity atherosclerosis. No
enlarged abdominal or pelvic lymph nodes.

Reproductive: Uterus and bilateral adnexa are unremarkable.

Other: No abdominal wall hernia or abnormality. No abdominopelvic
ascites.

Musculoskeletal: No acute or significant osseous findings.
IMPRESSION: 1. Appendicitis without evidence of associated perforation or
abscess.

ADDENDUM:
Results were discussed with Dr. Orlanda Hovis at [DATE] p.m. Eastern
on April 19, 2019.

*** End of Addendum ***
FINDINGS: Lower chest: No acute abnormality.

Hepatobiliary: No focal liver abnormality is seen. No gallstones,
gallbladder wall thickening, or biliary dilatation.

Pancreas: Unremarkable. No pancreatic ductal dilatation or
surrounding inflammatory changes.

Spleen: Normal in size without focal abnormality.

Adrenals/Urinary Tract: Adrenal glands are unremarkable. Kidneys are
normal, without renal calculi or hydronephrosis. A 9 mm simple cyst
is seen within the posterolateral aspect of the mid right kidney.
Bladder is unremarkable.

Stomach/Bowel: Stomach is within normal limits. A dilated (1.3 cm)
fluid-filled appendix is seen. There is a mild amount of
periappendiceal inflammatory fat stranding. A 1.0 cm appendicolith
is noted within the base of the appendix.

Vascular/Lymphatic: There is moderate severity atherosclerosis. No
enlarged abdominal or pelvic lymph nodes.

Reproductive: Uterus and bilateral adnexa are unremarkable.

Other: No abdominal wall hernia or abnormality. No abdominopelvic
ascites.

Musculoskeletal: No acute or significant osseous findings.
IMPRESSION: 1. Appendicitis without evidence of associated perforation or
abscess.

## 2021-10-28 DIAGNOSIS — E785 Hyperlipidemia, unspecified: Secondary | ICD-10-CM | POA: Diagnosis not present

## 2021-10-28 DIAGNOSIS — R7303 Prediabetes: Secondary | ICD-10-CM | POA: Diagnosis not present

## 2021-10-30 DIAGNOSIS — F411 Generalized anxiety disorder: Secondary | ICD-10-CM | POA: Diagnosis not present

## 2021-10-30 DIAGNOSIS — H9190 Unspecified hearing loss, unspecified ear: Secondary | ICD-10-CM | POA: Diagnosis not present

## 2021-10-30 DIAGNOSIS — E785 Hyperlipidemia, unspecified: Secondary | ICD-10-CM | POA: Diagnosis not present

## 2021-10-30 DIAGNOSIS — I10 Essential (primary) hypertension: Secondary | ICD-10-CM | POA: Diagnosis not present

## 2021-12-31 DIAGNOSIS — Z01419 Encounter for gynecological examination (general) (routine) without abnormal findings: Secondary | ICD-10-CM | POA: Diagnosis not present

## 2022-01-30 DIAGNOSIS — D485 Neoplasm of uncertain behavior of skin: Secondary | ICD-10-CM | POA: Diagnosis not present

## 2022-01-30 DIAGNOSIS — D2262 Melanocytic nevi of left upper limb, including shoulder: Secondary | ICD-10-CM | POA: Diagnosis not present

## 2022-01-30 DIAGNOSIS — L821 Other seborrheic keratosis: Secondary | ICD-10-CM | POA: Diagnosis not present

## 2022-01-30 DIAGNOSIS — D225 Melanocytic nevi of trunk: Secondary | ICD-10-CM | POA: Diagnosis not present

## 2022-01-30 DIAGNOSIS — Z1283 Encounter for screening for malignant neoplasm of skin: Secondary | ICD-10-CM | POA: Diagnosis not present

## 2022-01-30 DIAGNOSIS — B078 Other viral warts: Secondary | ICD-10-CM | POA: Diagnosis not present

## 2022-02-13 DIAGNOSIS — N390 Urinary tract infection, site not specified: Secondary | ICD-10-CM | POA: Diagnosis not present

## 2022-02-13 DIAGNOSIS — J069 Acute upper respiratory infection, unspecified: Secondary | ICD-10-CM | POA: Diagnosis not present

## 2022-04-25 DIAGNOSIS — R7303 Prediabetes: Secondary | ICD-10-CM | POA: Diagnosis not present

## 2022-04-25 DIAGNOSIS — I10 Essential (primary) hypertension: Secondary | ICD-10-CM | POA: Diagnosis not present

## 2022-04-25 DIAGNOSIS — N189 Chronic kidney disease, unspecified: Secondary | ICD-10-CM | POA: Diagnosis not present

## 2022-05-02 DIAGNOSIS — I10 Essential (primary) hypertension: Secondary | ICD-10-CM | POA: Diagnosis not present

## 2022-05-02 DIAGNOSIS — H9190 Unspecified hearing loss, unspecified ear: Secondary | ICD-10-CM | POA: Diagnosis not present

## 2022-05-02 DIAGNOSIS — F411 Generalized anxiety disorder: Secondary | ICD-10-CM | POA: Diagnosis not present

## 2022-05-02 DIAGNOSIS — N189 Chronic kidney disease, unspecified: Secondary | ICD-10-CM | POA: Diagnosis not present

## 2022-05-02 DIAGNOSIS — I129 Hypertensive chronic kidney disease with stage 1 through stage 4 chronic kidney disease, or unspecified chronic kidney disease: Secondary | ICD-10-CM | POA: Diagnosis not present

## 2022-05-02 DIAGNOSIS — E785 Hyperlipidemia, unspecified: Secondary | ICD-10-CM | POA: Diagnosis not present

## 2022-06-02 DIAGNOSIS — D225 Melanocytic nevi of trunk: Secondary | ICD-10-CM | POA: Diagnosis not present

## 2022-09-13 ENCOUNTER — Ambulatory Visit (INDEPENDENT_AMBULATORY_CARE_PROVIDER_SITE_OTHER): Payer: BC Managed Care – PPO

## 2022-09-13 ENCOUNTER — Ambulatory Visit
Admission: RE | Admit: 2022-09-13 | Discharge: 2022-09-13 | Disposition: A | Discharge status: Home / Self Care | Payer: BC Managed Care – PPO | Source: Ambulatory Visit

## 2022-09-13 VITALS — BP 131/83 | HR 84 | Temp 98.1°F | Resp 18 | Wt 177.0 lb

## 2022-09-13 DIAGNOSIS — J42 Unspecified chronic bronchitis: Secondary | ICD-10-CM

## 2022-09-13 DIAGNOSIS — J209 Acute bronchitis, unspecified: Secondary | ICD-10-CM

## 2022-09-13 DIAGNOSIS — R059 Cough, unspecified: Secondary | ICD-10-CM | POA: Diagnosis not present

## 2022-09-13 LAB — POCT URINALYSIS DIP (MANUAL ENTRY)
Bilirubin, UA: NEGATIVE
Blood, UA: NEGATIVE
Glucose, UA: NEGATIVE mg/dL
Ketones, POC UA: NEGATIVE mg/dL
Leukocytes, UA: NEGATIVE
Nitrite, UA: NEGATIVE
Protein Ur, POC: NEGATIVE mg/dL
Spec Grav, UA: 1.01 (ref 1.010–1.025)
Urobilinogen, UA: 0.2 E.U./dL
pH, UA: 6 (ref 5.0–8.0)

## 2022-09-13 MED ORDER — IPRATROPIUM-ALBUTEROL 0.5-2.5 (3) MG/3ML IN SOLN
3.0000 mL | Freq: Once | RESPIRATORY_TRACT | Status: AC
  Administered 2022-09-13: 3 mL via RESPIRATORY_TRACT

## 2022-09-13 MED ORDER — PREDNISONE 20 MG PO TABS: 40.0000 mg | ORAL_TABLET | Freq: Every day | ORAL | 0 refills | Status: AC

## 2022-09-13 MED ORDER — ALBUTEROL SULFATE HFA 108 (90 BASE) MCG/ACT IN AERS: 1.0000 | INHALATION_SPRAY | Freq: Four times a day (QID) | RESPIRATORY_TRACT | 0 refills | Status: AC | PRN

## 2022-09-13 MED ORDER — BENZONATATE 100 MG PO CAPS: 100.0000 mg | ORAL_CAPSULE | Freq: Three times a day (TID) | ORAL | 0 refills | Status: AC | PRN

## 2022-09-13 NOTE — Discharge Instructions (Signed)
The chest x-ray today does not show any pneumonia.  Urinalysis today is negative for UTI.  You have inflammation in your chest, likely initially from a viral upper respiratory infection.  We have given you a DuoNeb breathing treatment which helped improve air movement and help with wheezing.  Please continue your Ventolin inhaler every 4-6 hours as needed for wheezing or shortness of breath.  Continue your regular daily inhaler as well.  Start Mucinex 600 mg twice daily, nasal saline spray or lavage.  Start the oral prednisone to help with lung inflammation.  Take cough suppressant as needed for dry cough.  Make sure you are drinking plenty of water.  Seek care for persistent or worsening symptoms despite treatment.

## 2022-09-13 NOTE — ED Triage Notes (Signed)
Pt states that she feels like she has a UTI. She has flu sx when she has a UTI. Wheezing,coughing. Sx started 10 days ago.

## 2022-09-13 NOTE — ED Provider Notes (Signed)
[facility]    CSN: [id number] Arrival date & time: 09/13/22  7829      History   Chief Complaint Chief Complaint  Patient presents with   Cough    Entered by patient   Urinary Tract Infection    HPI Chloe Wall is a 62 y.o. female.   Patient presents today for 10 day history of congested cough, wheezing, stuffy nose, decreased appetite that is now improved, and fatigue.  Also having urinary frequency, has history of UTI with similar symptoms and wants to be checked today.  She denies fever, body aches or chills, shortness of breath or chest pain, chest tightness, sore throat, headache, abdominal pain, nausea/vomiting, diarrhea, urinary urgency, burning with urination, malodorous urine, and vaginal discharge.  Initially, was taking Coricidin without benefit, so she stopped taking it.    Denies history of asthma.  Reports she is a former smoker.  Recently started taking Flovent twice daily for breathing condition.  Also has rescue inhaler that has been prescribed to her but it is out of date and she has not needed it since it was prescribed.     Past Medical History:  Diagnosis Date   High cholesterol    Hypertension    Kidney infection    Mitral valve prolapse     Patient Active Problem List   Diagnosis Date Noted   Acute appendicitis 04/19/2019   Acute appendicitis with localized peritonitis, without perforation, abscess, or gangrene     Past Surgical History:  Procedure Laterality Date   LAPAROSCOPIC APPENDECTOMY N/A 04/19/2019   Procedure: APPENDECTOMY LAPAROSCOPIC;  Surgeon: Franky Macho, MD;  Location: [facility];  Service: General;  Laterality: N/A;   TONSILLECTOMY      OB History   No obstetric history on file.      Home Medications    Prior to Admission medications   Medication Sig Start Date End Date Taking? Authorizing Provider  albuterol (VENTOLIN HFA) 108 (90 Base) MCG/ACT inhaler Inhale 1-2 puffs into the lungs every 6 (six) hours as  needed for wheezing or shortness of breath. 09/13/22  Yes Cathlean Marseilles A, NP  amLODipine (NORVASC) 10 MG tablet Take 10 mg by mouth daily. 02/14/19  Yes [provider]  aspirin EC 81 MG tablet Take 81 mg by mouth daily.   Yes [provider]  benzonatate (TESSALON) 100 MG capsule Take 1 capsule (100 mg total) by mouth 3 (three) times daily as needed for cough. Do not take with alcohol or while driving or operating heavy machinery.  May cause drowsiness. 09/13/22  Yes Valentino Nose, NP  Cholecalciferol (VITAMIN D) 50 MCG (2000 UT) tablet Take 2,000 Units by mouth daily.   Yes [provider]  metoprolol succinate (TOPROL-XL) 25 MG 24 hr tablet Take 25 mg by mouth daily. 12/25/18  Yes [provider]  predniSONE (DELTASONE) 20 MG tablet Take 2 tablets (40 mg total) by mouth daily with breakfast for 5 days. 09/13/22 09/18/22 Yes Valentino Nose, NP  rosuvastatin (CRESTOR) 10 MG tablet Take 10 mg by mouth daily. 02/14/19  Yes [provider]  spironolactone (ALDACTONE) 25 MG tablet Take 25 mg by mouth daily.   Yes [provider]  traMADol (ULTRAM) 50 MG tablet Take 1 tablet (50 mg total) by mouth every 6 (six) hours as needed. 04/20/19   Franky Macho, MD    Family History History reviewed. No pertinent family history.  Social History Social History   Tobacco Use  Smoking status: Every Day    Types: Cigarettes   Smokeless tobacco: Never  Substance Use Topics   Alcohol use: Never   Drug use: Never     Allergies   Penicillins, Codeine, and Sulfa antibiotics   Review of Systems Review of Systems Per HPI  Physical Exam Triage Vital Signs ED Triage Vitals  Enc Vitals Group     BP 09/13/22 0957 131/83     Pulse Rate 09/13/22 0957 84     Resp 09/13/22 0957 18     Temp 09/13/22 0957 98.1 F (36.7 C)     Temp Source 09/13/22 0957 Oral     SpO2 09/13/22 0957 95 %     Weight 09/13/22 0955 177 lb (80.3 kg)     Height --       Head Circumference --      Peak Flow --      Pain Score 09/13/22 0955 0     Pain Loc --      Pain Edu? --      Excl. in GC? --    No data found.  Updated Vital Signs BP 131/83 (BP Location: Right Arm)   Pulse 84   Temp 98.1 F (36.7 C) (Oral)   Resp 18   Wt 177 lb (80.3 kg)   SpO2 95%   BMI 30.38 kg/m   SPO2 after duoneb: 97% room air  Visual Acuity Right Eye Distance:   Left Eye Distance:   Bilateral Distance:    Right Eye Near:   Left Eye Near:    Bilateral Near:     Physical Exam Vitals and nursing note reviewed.  Constitutional:      General: She is not in acute distress.    Appearance: Normal appearance. She is not ill-appearing or toxic-appearing.  HENT:     Head: Normocephalic and atraumatic.     Right Ear: Tympanic membrane, ear canal and external ear normal.     Left Ear: Tympanic membrane, ear canal and external ear normal.     Nose: No congestion or rhinorrhea.     Mouth/Throat:     Mouth: Mucous membranes are moist.     Pharynx: Oropharynx is clear. No oropharyngeal exudate or posterior oropharyngeal erythema.  Eyes:     General: No scleral icterus.    Extraocular Movements: Extraocular movements intact.  Cardiovascular:     Rate and Rhythm: Normal rate and regular rhythm.  Pulmonary:     Effort: Pulmonary effort is normal. No respiratory distress.     Breath sounds: Wheezing present. No rhonchi or rales.  Musculoskeletal:     Cervical back: Normal range of motion and neck supple.  Lymphadenopathy:     Cervical: No cervical adenopathy.  Skin:    General: Skin is warm and dry.     Coloration: Skin is not jaundiced or pale.     Findings: No erythema or rash.  Neurological:     Mental Status: She is alert and oriented to person, place, and time.     Motor: No weakness.  Psychiatric:        Behavior: Behavior is cooperative.      UC Treatments / Results  Labs (all labs ordered are listed, but only abnormal results are displayed) Labs  Reviewed  POCT URINALYSIS DIP (MANUAL ENTRY)    EKG   Radiology DG Chest 2 View  Result Date: 09/13/2022 CLINICAL DATA:  Cough for 10 days. EXAM: CHEST - 2 VIEW COMPARISON:  04/19/2019. FINDINGS: Normal  heart, mediastinum and hila. Clear lungs.  No pleural effusion or pneumothorax. Skeletal structures are intact. IMPRESSION: No active cardiopulmonary disease. Electronically Signed   By: Amie Portland M.D.   On: 09/13/2022 10:39    Procedures Procedures (including critical care time)  Medications Ordered in UC Medications  ipratropium-albuterol (DUONEB) 0.5-2.5 (3) MG/3ML nebulizer solution 3 mL (3 mLs Nebulization Given 09/13/22 1027)    Initial Impression / Assessment and Plan / UC Course  I have reviewed the triage vital signs and the nursing notes.  Pertinent labs & imaging results that were available during my care of the patient were reviewed by me and considered in my medical decision making (see chart for details).   Patient is well-appearing, normotensive, afebrile, not tachycardic, not tachypneic, oxygenating well on room air.    1.Acute exacerbation of chronic bronchitis (HCC) Chest xray today is negative for acute cardiopulmonary process Duoneb given with improvement in SPO2, wheezing slightly improved, patient reported subjective improvement in breathing Refill sent for Ventolin inhaler to start using every 4-6 hours prn wheezing/shortness of breath Start oral prednisone daily for 5 days Other supportive care discussed, start cough suppressant medication for dry cough Seek care for persistent or worsening symptoms despite treatment   The patient was given the opportunity to ask questions.  All questions answered to their satisfaction.  The patient is in agreement to this plan.    Final Clinical Impressions(s) / UC Diagnoses   Final diagnoses:  Acute exacerbation of chronic bronchitis (HCC)     Discharge Instructions      The chest x-ray today does not show any  pneumonia.  Urinalysis today is negative for UTI.  You have inflammation in your chest, likely initially from a viral upper respiratory infection.  We have given you a DuoNeb breathing treatment which helped improve air movement and help with wheezing.  Please continue your Ventolin inhaler every 4-6 hours as needed for wheezing or shortness of breath.  Continue your regular daily inhaler as well.  Start Mucinex 600 mg twice daily, nasal saline spray or lavage.  Start the oral prednisone to help with lung inflammation.  Take cough suppressant as needed for dry cough.  Make sure you are drinking plenty of water.  Seek care for persistent or worsening symptoms despite treatment.     ED Prescriptions     Medication Sig Dispense Auth. Provider   albuterol (VENTOLIN HFA) 108 (90 Base) MCG/ACT inhaler Inhale 1-2 puffs into the lungs every 6 (six) hours as needed for wheezing or shortness of breath. 18 g Cathlean Marseilles A, NP   predniSONE (DELTASONE) 20 MG tablet Take 2 tablets (40 mg total) by mouth daily with breakfast for 5 days. 10 tablet Cathlean Marseilles A, NP   benzonatate (TESSALON) 100 MG capsule Take 1 capsule (100 mg total) by mouth 3 (three) times daily as needed for cough. Do not take with alcohol or while driving or operating heavy machinery.  May cause drowsiness. 21 capsule Valentino Nose, NP      PDMP not reviewed this encounter.   Valentino Nose, NP 09/13/22 [phone]

## 2022-10-24 DIAGNOSIS — R7303 Prediabetes: Secondary | ICD-10-CM | POA: Diagnosis not present

## 2022-10-24 DIAGNOSIS — I10 Essential (primary) hypertension: Secondary | ICD-10-CM | POA: Diagnosis not present

## 2022-10-24 DIAGNOSIS — E538 Deficiency of other specified B group vitamins: Secondary | ICD-10-CM | POA: Diagnosis not present

## 2022-10-24 DIAGNOSIS — N189 Chronic kidney disease, unspecified: Secondary | ICD-10-CM | POA: Diagnosis not present

## 2022-10-31 DIAGNOSIS — H9190 Unspecified hearing loss, unspecified ear: Secondary | ICD-10-CM | POA: Diagnosis not present

## 2022-10-31 DIAGNOSIS — I129 Hypertensive chronic kidney disease with stage 1 through stage 4 chronic kidney disease, or unspecified chronic kidney disease: Secondary | ICD-10-CM | POA: Diagnosis not present

## 2022-10-31 DIAGNOSIS — F411 Generalized anxiety disorder: Secondary | ICD-10-CM | POA: Diagnosis not present

## 2022-10-31 DIAGNOSIS — E785 Hyperlipidemia, unspecified: Secondary | ICD-10-CM | POA: Diagnosis not present

## 2022-10-31 DIAGNOSIS — I10 Essential (primary) hypertension: Secondary | ICD-10-CM | POA: Diagnosis not present

## 2022-12-01 DIAGNOSIS — C44311 Basal cell carcinoma of skin of nose: Secondary | ICD-10-CM | POA: Diagnosis not present

## 2022-12-01 DIAGNOSIS — D225 Melanocytic nevi of trunk: Secondary | ICD-10-CM | POA: Diagnosis not present

## 2022-12-01 DIAGNOSIS — Z1283 Encounter for screening for malignant neoplasm of skin: Secondary | ICD-10-CM | POA: Diagnosis not present

## 2023-01-12 DIAGNOSIS — Z85828 Personal history of other malignant neoplasm of skin: Secondary | ICD-10-CM | POA: Diagnosis not present

## 2023-01-12 DIAGNOSIS — Z08 Encounter for follow-up examination after completed treatment for malignant neoplasm: Secondary | ICD-10-CM | POA: Diagnosis not present

## 2023-04-30 DIAGNOSIS — R7303 Prediabetes: Secondary | ICD-10-CM | POA: Diagnosis not present

## 2023-04-30 DIAGNOSIS — N189 Chronic kidney disease, unspecified: Secondary | ICD-10-CM | POA: Diagnosis not present

## 2023-04-30 DIAGNOSIS — E538 Deficiency of other specified B group vitamins: Secondary | ICD-10-CM | POA: Diagnosis not present

## 2023-04-30 DIAGNOSIS — I10 Essential (primary) hypertension: Secondary | ICD-10-CM | POA: Diagnosis not present

## 2023-05-05 DIAGNOSIS — H353221 Exudative age-related macular degeneration, left eye, with active choroidal neovascularization: Secondary | ICD-10-CM | POA: Diagnosis not present

## 2023-05-05 DIAGNOSIS — H2513 Age-related nuclear cataract, bilateral: Secondary | ICD-10-CM | POA: Diagnosis not present

## 2023-05-05 DIAGNOSIS — H353132 Nonexudative age-related macular degeneration, bilateral, intermediate dry stage: Secondary | ICD-10-CM | POA: Diagnosis not present

## 2023-05-06 DIAGNOSIS — N189 Chronic kidney disease, unspecified: Secondary | ICD-10-CM | POA: Diagnosis not present

## 2023-05-06 DIAGNOSIS — F411 Generalized anxiety disorder: Secondary | ICD-10-CM | POA: Diagnosis not present

## 2023-05-06 DIAGNOSIS — I10 Essential (primary) hypertension: Secondary | ICD-10-CM | POA: Diagnosis not present

## 2023-05-06 DIAGNOSIS — H9190 Unspecified hearing loss, unspecified ear: Secondary | ICD-10-CM | POA: Diagnosis not present

## 2023-05-06 DIAGNOSIS — E785 Hyperlipidemia, unspecified: Secondary | ICD-10-CM | POA: Diagnosis not present

## 2023-05-06 DIAGNOSIS — I129 Hypertensive chronic kidney disease with stage 1 through stage 4 chronic kidney disease, or unspecified chronic kidney disease: Secondary | ICD-10-CM | POA: Diagnosis not present

## 2023-05-07 DIAGNOSIS — H353221 Exudative age-related macular degeneration, left eye, with active choroidal neovascularization: Secondary | ICD-10-CM | POA: Diagnosis not present

## 2023-05-07 DIAGNOSIS — H353132 Nonexudative age-related macular degeneration, bilateral, intermediate dry stage: Secondary | ICD-10-CM | POA: Diagnosis not present

## 2023-06-11 DIAGNOSIS — H2513 Age-related nuclear cataract, bilateral: Secondary | ICD-10-CM | POA: Diagnosis not present

## 2023-06-11 DIAGNOSIS — H353221 Exudative age-related macular degeneration, left eye, with active choroidal neovascularization: Secondary | ICD-10-CM | POA: Diagnosis not present

## 2023-06-11 DIAGNOSIS — H353132 Nonexudative age-related macular degeneration, bilateral, intermediate dry stage: Secondary | ICD-10-CM | POA: Diagnosis not present

## 2023-07-13 DIAGNOSIS — G4730 Sleep apnea, unspecified: Secondary | ICD-10-CM | POA: Diagnosis not present

## 2023-07-13 DIAGNOSIS — Z85828 Personal history of other malignant neoplasm of skin: Secondary | ICD-10-CM | POA: Diagnosis not present

## 2023-07-13 DIAGNOSIS — Z08 Encounter for follow-up examination after completed treatment for malignant neoplasm: Secondary | ICD-10-CM | POA: Diagnosis not present

## 2023-07-13 DIAGNOSIS — D225 Melanocytic nevi of trunk: Secondary | ICD-10-CM | POA: Diagnosis not present

## 2023-07-13 DIAGNOSIS — Z1283 Encounter for screening for malignant neoplasm of skin: Secondary | ICD-10-CM | POA: Diagnosis not present

## 2023-07-16 DIAGNOSIS — H353132 Nonexudative age-related macular degeneration, bilateral, intermediate dry stage: Secondary | ICD-10-CM | POA: Diagnosis not present

## 2023-07-16 DIAGNOSIS — H2513 Age-related nuclear cataract, bilateral: Secondary | ICD-10-CM | POA: Diagnosis not present

## 2023-07-16 DIAGNOSIS — H353221 Exudative age-related macular degeneration, left eye, with active choroidal neovascularization: Secondary | ICD-10-CM | POA: Diagnosis not present

## 2023-07-16 DIAGNOSIS — H43823 Vitreomacular adhesion, bilateral: Secondary | ICD-10-CM | POA: Diagnosis not present

## 2023-07-16 DIAGNOSIS — H353211 Exudative age-related macular degeneration, right eye, with active choroidal neovascularization: Secondary | ICD-10-CM | POA: Diagnosis not present

## 2023-07-20 DIAGNOSIS — H2513 Age-related nuclear cataract, bilateral: Secondary | ICD-10-CM | POA: Diagnosis not present

## 2023-07-20 DIAGNOSIS — H353132 Nonexudative age-related macular degeneration, bilateral, intermediate dry stage: Secondary | ICD-10-CM | POA: Diagnosis not present

## 2023-07-20 DIAGNOSIS — H43823 Vitreomacular adhesion, bilateral: Secondary | ICD-10-CM | POA: Diagnosis not present

## 2023-07-20 DIAGNOSIS — H353211 Exudative age-related macular degeneration, right eye, with active choroidal neovascularization: Secondary | ICD-10-CM | POA: Diagnosis not present

## 2023-07-20 DIAGNOSIS — H353221 Exudative age-related macular degeneration, left eye, with active choroidal neovascularization: Secondary | ICD-10-CM | POA: Diagnosis not present

## 2023-08-24 DIAGNOSIS — H353231 Exudative age-related macular degeneration, bilateral, with active choroidal neovascularization: Secondary | ICD-10-CM | POA: Diagnosis not present

## 2023-08-24 DIAGNOSIS — H353132 Nonexudative age-related macular degeneration, bilateral, intermediate dry stage: Secondary | ICD-10-CM | POA: Diagnosis not present

## 2023-08-24 DIAGNOSIS — H02833 Dermatochalasis of right eye, unspecified eyelid: Secondary | ICD-10-CM | POA: Diagnosis not present

## 2023-08-24 DIAGNOSIS — H2513 Age-related nuclear cataract, bilateral: Secondary | ICD-10-CM | POA: Diagnosis not present

## 2023-08-24 DIAGNOSIS — H43823 Vitreomacular adhesion, bilateral: Secondary | ICD-10-CM | POA: Diagnosis not present

## 2023-09-22 DIAGNOSIS — R92323 Mammographic fibroglandular density, bilateral breasts: Secondary | ICD-10-CM | POA: Diagnosis not present

## 2023-09-22 DIAGNOSIS — Z1231 Encounter for screening mammogram for malignant neoplasm of breast: Secondary | ICD-10-CM | POA: Diagnosis not present

## 2023-09-28 DIAGNOSIS — H353231 Exudative age-related macular degeneration, bilateral, with active choroidal neovascularization: Secondary | ICD-10-CM | POA: Diagnosis not present

## 2023-09-28 DIAGNOSIS — H353132 Nonexudative age-related macular degeneration, bilateral, intermediate dry stage: Secondary | ICD-10-CM | POA: Diagnosis not present

## 2023-09-28 DIAGNOSIS — H43823 Vitreomacular adhesion, bilateral: Secondary | ICD-10-CM | POA: Diagnosis not present

## 2023-09-28 DIAGNOSIS — H02836 Dermatochalasis of left eye, unspecified eyelid: Secondary | ICD-10-CM | POA: Diagnosis not present

## 2023-09-28 DIAGNOSIS — H02833 Dermatochalasis of right eye, unspecified eyelid: Secondary | ICD-10-CM | POA: Diagnosis not present

## 2023-10-28 DIAGNOSIS — N189 Chronic kidney disease, unspecified: Secondary | ICD-10-CM | POA: Diagnosis not present

## 2023-10-28 DIAGNOSIS — E538 Deficiency of other specified B group vitamins: Secondary | ICD-10-CM | POA: Diagnosis not present

## 2023-10-28 DIAGNOSIS — I10 Essential (primary) hypertension: Secondary | ICD-10-CM | POA: Diagnosis not present

## 2023-10-28 DIAGNOSIS — R7303 Prediabetes: Secondary | ICD-10-CM | POA: Diagnosis not present

## 2023-11-04 DIAGNOSIS — R61 Generalized hyperhidrosis: Secondary | ICD-10-CM | POA: Diagnosis not present

## 2023-11-04 DIAGNOSIS — E785 Hyperlipidemia, unspecified: Secondary | ICD-10-CM | POA: Diagnosis not present

## 2023-11-04 DIAGNOSIS — Z0001 Encounter for general adult medical examination with abnormal findings: Secondary | ICD-10-CM | POA: Diagnosis not present

## 2023-11-04 DIAGNOSIS — I10 Essential (primary) hypertension: Secondary | ICD-10-CM | POA: Diagnosis not present

## 2023-11-04 DIAGNOSIS — H9190 Unspecified hearing loss, unspecified ear: Secondary | ICD-10-CM | POA: Diagnosis not present

## 2023-11-04 DIAGNOSIS — F411 Generalized anxiety disorder: Secondary | ICD-10-CM | POA: Diagnosis not present

## 2023-11-09 DIAGNOSIS — H353231 Exudative age-related macular degeneration, bilateral, with active choroidal neovascularization: Secondary | ICD-10-CM | POA: Diagnosis not present

## 2023-11-09 DIAGNOSIS — H02833 Dermatochalasis of right eye, unspecified eyelid: Secondary | ICD-10-CM | POA: Diagnosis not present

## 2023-11-09 DIAGNOSIS — H2513 Age-related nuclear cataract, bilateral: Secondary | ICD-10-CM | POA: Diagnosis not present

## 2023-11-09 DIAGNOSIS — H353132 Nonexudative age-related macular degeneration, bilateral, intermediate dry stage: Secondary | ICD-10-CM | POA: Diagnosis not present

## 2023-11-09 DIAGNOSIS — H43823 Vitreomacular adhesion, bilateral: Secondary | ICD-10-CM | POA: Diagnosis not present

## 2023-11-11 DIAGNOSIS — R928 Other abnormal and inconclusive findings on diagnostic imaging of breast: Secondary | ICD-10-CM | POA: Diagnosis not present

## 2023-11-11 DIAGNOSIS — R92322 Mammographic fibroglandular density, left breast: Secondary | ICD-10-CM | POA: Diagnosis not present

## 2023-12-21 DIAGNOSIS — H353132 Nonexudative age-related macular degeneration, bilateral, intermediate dry stage: Secondary | ICD-10-CM | POA: Diagnosis not present

## 2023-12-21 DIAGNOSIS — H2513 Age-related nuclear cataract, bilateral: Secondary | ICD-10-CM | POA: Diagnosis not present

## 2023-12-21 DIAGNOSIS — H353231 Exudative age-related macular degeneration, bilateral, with active choroidal neovascularization: Secondary | ICD-10-CM | POA: Diagnosis not present

## 2023-12-21 DIAGNOSIS — H43823 Vitreomacular adhesion, bilateral: Secondary | ICD-10-CM | POA: Diagnosis not present

## 2024-02-08 DIAGNOSIS — H353231 Exudative age-related macular degeneration, bilateral, with active choroidal neovascularization: Secondary | ICD-10-CM | POA: Diagnosis not present

## 2024-02-08 DIAGNOSIS — H353132 Nonexudative age-related macular degeneration, bilateral, intermediate dry stage: Secondary | ICD-10-CM | POA: Diagnosis not present

## 2024-02-08 DIAGNOSIS — H2513 Age-related nuclear cataract, bilateral: Secondary | ICD-10-CM | POA: Diagnosis not present

## 2024-02-08 DIAGNOSIS — H43823 Vitreomacular adhesion, bilateral: Secondary | ICD-10-CM | POA: Diagnosis not present

## 2024-03-14 DIAGNOSIS — N189 Chronic kidney disease, unspecified: Secondary | ICD-10-CM | POA: Diagnosis not present

## 2024-03-14 DIAGNOSIS — R7303 Prediabetes: Secondary | ICD-10-CM | POA: Diagnosis not present

## 2024-03-14 DIAGNOSIS — I10 Essential (primary) hypertension: Secondary | ICD-10-CM | POA: Diagnosis not present

## 2024-03-14 DIAGNOSIS — E538 Deficiency of other specified B group vitamins: Secondary | ICD-10-CM | POA: Diagnosis not present

## 2024-03-14 DIAGNOSIS — R61 Generalized hyperhidrosis: Secondary | ICD-10-CM | POA: Diagnosis not present
